# Patient Record
Sex: Female | Born: 1960 | Race: White | Hispanic: Yes | Smoking: Former smoker
Health system: Southern US, Community
[De-identification: ages and names within clinical notes are randomized; demographics above are authoritative.]

## PROBLEM LIST (undated history)

## (undated) DIAGNOSIS — K929 Disease of digestive system, unspecified: Secondary | ICD-10-CM

## (undated) DIAGNOSIS — K760 Fatty (change of) liver, not elsewhere classified: Secondary | ICD-10-CM

## (undated) DIAGNOSIS — R600 Localized edema: Secondary | ICD-10-CM

## (undated) DIAGNOSIS — F329 Major depressive disorder, single episode, unspecified: Secondary | ICD-10-CM

## (undated) DIAGNOSIS — E669 Obesity, unspecified: Secondary | ICD-10-CM

## (undated) DIAGNOSIS — K635 Polyp of colon: Secondary | ICD-10-CM

## (undated) DIAGNOSIS — E785 Hyperlipidemia, unspecified: Secondary | ICD-10-CM

## (undated) DIAGNOSIS — M549 Dorsalgia, unspecified: Secondary | ICD-10-CM

## (undated) DIAGNOSIS — F419 Anxiety disorder, unspecified: Secondary | ICD-10-CM

## (undated) DIAGNOSIS — R51 Headache: Secondary | ICD-10-CM

## (undated) DIAGNOSIS — F32A Depression, unspecified: Secondary | ICD-10-CM

## (undated) DIAGNOSIS — T7840XA Allergy, unspecified, initial encounter: Secondary | ICD-10-CM

## (undated) DIAGNOSIS — U099 Post covid-19 condition, unspecified: Secondary | ICD-10-CM

## (undated) DIAGNOSIS — K219 Gastro-esophageal reflux disease without esophagitis: Secondary | ICD-10-CM

## (undated) HISTORY — DX: Dorsalgia, unspecified: M54.9

## (undated) HISTORY — DX: Gastro-esophageal reflux disease without esophagitis: K21.9

## (undated) HISTORY — DX: Fatty (change of) liver, not elsewhere classified: K76.0

## (undated) HISTORY — DX: Hyperlipidemia, unspecified: E78.5

## (undated) HISTORY — DX: Disease of digestive system, unspecified: K92.9

## (undated) HISTORY — DX: Headache: R51

## (undated) HISTORY — DX: Obesity, unspecified: E66.9

## (undated) HISTORY — PX: OTHER SURGICAL HISTORY: SHX169

## (undated) HISTORY — DX: Polyp of colon: K63.5

## (undated) HISTORY — DX: Localized edema: R60.0

## (undated) HISTORY — DX: Post covid-19 condition, unspecified: U09.9

## (undated) HISTORY — DX: Anxiety disorder, unspecified: F41.9

## (undated) HISTORY — PX: LAPAROSCOPY: SHX197

## (undated) HISTORY — PX: TONSILLECTOMY AND ADENOIDECTOMY: SUR1326

## (undated) HISTORY — DX: Major depressive disorder, single episode, unspecified: F32.9

## (undated) HISTORY — DX: Depression, unspecified: F32.A

## (undated) HISTORY — DX: Allergy, unspecified, initial encounter: T78.40XA

---

## 1985-07-07 HISTORY — PX: BREAST BIOPSY: SHX20

## 2000-07-07 HISTORY — PX: UTERINE FIBROID SURGERY: SHX826

## 2001-06-20 ENCOUNTER — Encounter (INDEPENDENT_AMBULATORY_CARE_PROVIDER_SITE_OTHER): Payer: Self-pay

## 2001-06-21 ENCOUNTER — Encounter: Payer: Self-pay | Admitting: Emergency Medicine

## 2001-06-21 ENCOUNTER — Inpatient Hospital Stay (HOSPITAL_COMMUNITY): Admission: EM | Admit: 2001-06-21 | Discharge: 2001-06-23 | Payer: Self-pay | Admitting: Emergency Medicine

## 2002-05-09 ENCOUNTER — Emergency Department (HOSPITAL_COMMUNITY): Admission: EM | Admit: 2002-05-09 | Discharge: 2002-05-09 | Payer: Self-pay | Admitting: Emergency Medicine

## 2006-05-01 ENCOUNTER — Other Ambulatory Visit: Admission: RE | Admit: 2006-05-01 | Discharge: 2006-05-01 | Payer: Self-pay | Admitting: Obstetrics and Gynecology

## 2006-09-14 ENCOUNTER — Other Ambulatory Visit: Admission: RE | Admit: 2006-09-14 | Discharge: 2006-09-14 | Payer: Self-pay | Admitting: Obstetrics and Gynecology

## 2007-04-08 ENCOUNTER — Ambulatory Visit: Payer: Self-pay | Admitting: Internal Medicine

## 2007-04-08 ENCOUNTER — Encounter: Payer: Self-pay | Admitting: Internal Medicine

## 2007-04-08 DIAGNOSIS — R635 Abnormal weight gain: Secondary | ICD-10-CM | POA: Insufficient documentation

## 2007-04-08 DIAGNOSIS — R109 Unspecified abdominal pain: Secondary | ICD-10-CM | POA: Insufficient documentation

## 2007-04-08 LAB — CONVERTED CEMR LAB
ALT: 18 units/L (ref 0–35)
AST: 20 units/L (ref 0–37)
Albumin: 3.9 g/dL (ref 3.5–5.2)
Alkaline Phosphatase: 86 units/L (ref 39–117)
BUN: 11 mg/dL (ref 6–23)
Bacteria, UA: NEGATIVE
Basophils Absolute: 0 10*3/uL (ref 0.0–0.1)
Basophils Relative: 0.6 % (ref 0.0–1.0)
Bilirubin Urine: NEGATIVE
Bilirubin, Direct: 0.1 mg/dL (ref 0.0–0.3)
CO2: 30 meq/L (ref 19–32)
Calcium: 9.8 mg/dL (ref 8.4–10.5)
Chloride: 109 meq/L (ref 96–112)
Cholesterol: 222 mg/dL (ref 0–200)
Creatinine, Ser: 0.8 mg/dL (ref 0.4–1.2)
Crystals: NEGATIVE
Direct LDL: 139.6 mg/dL
Eosinophils Absolute: 0.2 10*3/uL (ref 0.0–0.6)
Eosinophils Relative: 2.6 % (ref 0.0–5.0)
GFR calc Af Amer: 100 mL/min
GFR calc non Af Amer: 82 mL/min
Glucose, Bld: 93 mg/dL (ref 70–99)
HCT: 39.3 % (ref 36.0–46.0)
HDL: 74.1 mg/dL (ref >39.0)
Hemoglobin: 13.1 g/dL (ref 12.0–15.0)
Hgb A1c MFr Bld: 5.5 % (ref 4.6–6.0)
Ketones, ur: NEGATIVE mg/dL
Leukocytes, UA: NEGATIVE
Lymphocytes Relative: 46.7 % — ABNORMAL HIGH (ref 12.0–46.0)
MCHC: 33.4 g/dL (ref 30.0–36.0)
MCV: 85.6 fL (ref 78.0–100.0)
Monocytes Absolute: 0.4 10*3/uL (ref 0.2–0.7)
Monocytes Relative: 6.4 % (ref 3.0–11.0)
Mucus, UA: NEGATIVE
Neutro Abs: 2.9 10*3/uL (ref 1.4–7.7)
Neutrophils Relative %: 43.7 % (ref 43.0–77.0)
Nitrite: NEGATIVE
Platelets: 197 10*3/uL (ref 150–400)
Potassium: 4.7 meq/L (ref 3.5–5.1)
RBC: 4.6 M/uL (ref 3.87–5.11)
RDW: 12.8 % (ref 11.5–14.6)
Sodium: 146 meq/L — ABNORMAL HIGH (ref 135–145)
Specific Gravity, Urine: 1.01 (ref 1.000–1.03)
TSH: 1.26 microintl units/mL (ref 0.35–5.50)
Total Bilirubin: 0.5 mg/dL (ref 0.3–1.2)
Total CHOL/HDL Ratio: 3
Total Protein, Urine: NEGATIVE mg/dL
Total Protein: 7.4 g/dL (ref 6.0–8.3)
Triglycerides: 45 mg/dL (ref 0–149)
Urine Glucose: NEGATIVE mg/dL
Urobilinogen, UA: 0.2 (ref 0.0–1.0)
VLDL: 9 mg/dL (ref 0–40)
WBC: 6.7 10*3/uL (ref 4.5–10.5)
pH: 6 (ref 5.0–8.0)

## 2007-04-09 DIAGNOSIS — R519 Headache, unspecified: Secondary | ICD-10-CM | POA: Insufficient documentation

## 2007-04-09 DIAGNOSIS — R51 Headache: Secondary | ICD-10-CM | POA: Insufficient documentation

## 2007-04-09 DIAGNOSIS — E785 Hyperlipidemia, unspecified: Secondary | ICD-10-CM | POA: Insufficient documentation

## 2007-04-30 ENCOUNTER — Ambulatory Visit: Payer: Self-pay | Admitting: Internal Medicine

## 2007-06-28 ENCOUNTER — Other Ambulatory Visit: Admission: RE | Admit: 2007-06-28 | Discharge: 2007-06-28 | Payer: Self-pay | Admitting: Obstetrics and Gynecology

## 2007-11-05 ENCOUNTER — Encounter
Admission: RE | Admit: 2007-11-05 | Discharge: 2007-11-05 | Payer: Self-pay | Admitting: Physical Medicine and Rehabilitation

## 2008-02-16 ENCOUNTER — Encounter
Admission: RE | Admit: 2008-02-16 | Discharge: 2008-02-16 | Payer: Self-pay | Admitting: Physical Medicine & Rehabilitation

## 2008-10-20 ENCOUNTER — Other Ambulatory Visit: Admission: RE | Admit: 2008-10-20 | Discharge: 2008-10-20 | Payer: Self-pay | Admitting: Obstetrics & Gynecology

## 2008-10-24 ENCOUNTER — Encounter: Admission: RE | Admit: 2008-10-24 | Discharge: 2008-10-24 | Payer: Self-pay | Admitting: Obstetrics and Gynecology

## 2010-11-22 NOTE — Discharge Summary (Signed)
[facility]  Patient:    Linda Mata, Linda Mata Visit Number: [id number] MRN: [id number]          Service Type: SUR Location: 3W 0357 01 Attending Physician:  Lendon Colonel Dictated by:   Kathie Rhodes. Kyra Manges, M.D. Admit Date:  06/20/2001 Discharge Date: 06/23/2001   CC:         Anselm Pancoast. Zachery Dakins, M.D.   Discharge Summary  ADMISSION DIAGNOSIS:  Acute abdomen.  DISCHARGE DIAGNOSIS:  Infarcted uterine leiomyoma.  OPERATIONS PERFORMED:  Diagnostic laparoscopy with exploratory laparotomy and removal of infarct myoma.  BRIEF HISTORY:  [name] was admitted from the ER with an acute abdomen, initially felt to be possible ruptured appendix.  LABORATORY DATA:  The admission hemoglobin was 13 and white count 14,000. Routine chemistries were within normal limits.  Culture of the peritoneal fluid revealed rare white cells.  No growth was noted.  The pathology report revealed an infarcted myoma.  HOSPITAL COURSE:  The patient was admitted to the hospital and underwent an uneventful laparoscopy and laparotomy with excision of infarcted myoma. Postoperatively she did quite well.  DISPOSITION:  The patient was discharged on June 21, 2001, to home and office care.  FOLLOW-UP:  She will be followed up by Trinda Pascal, N.P., can call for fever, bleeding, or any other difficulty that she may encounter.  CONDITION ON DISCHARGE:  Improved. Dictated by:   S. Kyra Manges, M.D. Attending Physician:  Lendon Colonel DD:  07/02/01 TD:  07/03/01 Job: [id number]

## 2010-11-22 NOTE — Op Note (Signed)
[facility]  Patient:    Linda Mata, CADMUS Visit Number: [id number] MRN: [id number]          Service Type: SUR Location: 3W 0357 01 Attending Physician:  Henrene Dodge Dictated by:   Kathie Rhodes. Kyra Manges, M.D. Proc. Date: 06/21/01 Admit Date:  06/20/2001                             Operative Report  PREOPERATIVE DIAGNOSIS:  Acute abdomen, possible ruptured appendix.  POSTOPERATIVE DIAGNOSIS:  Infarcted uterine fibroid.  OPERATION PERFORMED:  Diagnostic laparoscopy with exploratory laparotomy, removal of posterior myoma.  DESCRIPTION OF PROCEDURE:  The patient was placed in lithotomy position and prepped and draped in the usual fashion.  Foley catheter was inserted, and the patient was examined.  There was a large posterior mass that was quite firm and was not consistent with a pelvic abscess.  A transverse incision was made in the umbilicus.  The abdomen was distended with carbon dioxide using  a Veress needle.  Aspiration infusion technique was utilized.  About two liters of CO2 were infused.  Trocar was inserted into the abdomen and visualization of the pelvis accomplished.  There was a large posterior mass that had secondary inflammation around the sigmoid colon and the cecum.  The appendix was visualized and normal.  The right tube was edematous.  The right ovary was normal in appearance.  The left tube and ovary were normal.  The pelvic mass was felt to need more exploration, so a transverse incision was made in the abdomen, hemostasis with the Bovie.  The fascia was opened transversely and the peritoneum opened vertically.  The fibroid was the delivered into the operative wound, was excised.  The base of the fibroid was quite small, less than 0.5 cm, and this was closed with figure-of-eight 0 chromic sutures. There was a small fibroid anteriorly, and this was left alone.  The left ovary and tube were normal.  The right tube was edematous.   Copious amounts of saline were used to irrigate the pelvis.  The appendix was normal.  The parietal peritoneum was then closed with 2-0 PDS.  The fascia was closed with 2-0 PDS and 2-0 chromic figure-of-eight sutures.  The subcutaneum was closed with 2-0 Vicryl interrupted, and the skin was closed with a subcuticular suture of 4-0 PDS.  The umbilical incision was closed with one suture of 4-0 PDS.  The patient was then awakened and carried to recovery room in good condition. Dictated by:   S. Kyra Manges, M.D. Attending Physician:  Henrene Dodge DD:  06/21/01 TD:  06/22/01 Job: [id number]

## 2010-12-10 ENCOUNTER — Other Ambulatory Visit: Payer: Self-pay | Admitting: Internal Medicine

## 2010-12-13 ENCOUNTER — Ambulatory Visit
Admission: RE | Admit: 2010-12-13 | Discharge: 2010-12-13 | Disposition: A | Discharge status: Home / Self Care | Payer: BC Managed Care – PPO | Source: Ambulatory Visit | Attending: Internal Medicine | Admitting: Internal Medicine

## 2010-12-13 ENCOUNTER — Other Ambulatory Visit: Payer: Self-pay | Admitting: Internal Medicine

## 2010-12-13 DIAGNOSIS — R102 Pelvic and perineal pain: Secondary | ICD-10-CM

## 2010-12-18 ENCOUNTER — Other Ambulatory Visit: Payer: Self-pay | Admitting: Internal Medicine

## 2010-12-18 ENCOUNTER — Ambulatory Visit
Admission: RE | Admit: 2010-12-18 | Discharge: 2010-12-18 | Disposition: A | Discharge status: Home / Self Care | Payer: BC Managed Care – PPO | Source: Ambulatory Visit | Attending: Internal Medicine | Admitting: Internal Medicine

## 2010-12-18 DIAGNOSIS — Z1231 Encounter for screening mammogram for malignant neoplasm of breast: Secondary | ICD-10-CM

## 2010-12-18 DIAGNOSIS — R102 Pelvic and perineal pain: Secondary | ICD-10-CM

## 2011-09-17 ENCOUNTER — Emergency Department (HOSPITAL_BASED_OUTPATIENT_CLINIC_OR_DEPARTMENT_OTHER)
Admission: EM | Admit: 2011-09-17 | Discharge: 2011-09-17 | Disposition: A | Discharge status: Home / Self Care | Payer: BC Managed Care – PPO

## 2011-09-24 ENCOUNTER — Other Ambulatory Visit: Payer: Self-pay | Admitting: Internal Medicine

## 2011-09-24 ENCOUNTER — Telehealth: Payer: Self-pay | Admitting: Internal Medicine

## 2011-09-24 DIAGNOSIS — M79672 Pain in left foot: Secondary | ICD-10-CM

## 2011-09-24 NOTE — Telephone Encounter (Signed)
Yes that's fine 

## 2011-09-24 NOTE — Telephone Encounter (Signed)
Called patient made appt for Monday 3.25.13 @ 11:15

## 2011-09-24 NOTE — Telephone Encounter (Signed)
Patient called to set up a NPE with Paz. She states she is having some acute symptoms of edema in her joint as well as unexplained bruising. Can I put in on Paz schedule for the acute visit only, so he can see her for this before the NPE appointment?

## 2011-09-27 ENCOUNTER — Other Ambulatory Visit: Payer: BC Managed Care – PPO

## 2011-09-29 ENCOUNTER — Telehealth: Payer: Self-pay | Admitting: Internal Medicine

## 2011-09-29 ENCOUNTER — Encounter: Payer: Self-pay | Admitting: *Deleted

## 2011-09-29 ENCOUNTER — Ambulatory Visit (INDEPENDENT_AMBULATORY_CARE_PROVIDER_SITE_OTHER)
Admission: RE | Admit: 2011-09-29 | Discharge: 2011-09-29 | Disposition: A | Discharge status: Home / Self Care | Payer: BC Managed Care – PPO | Source: Ambulatory Visit | Attending: Internal Medicine | Admitting: Internal Medicine

## 2011-09-29 ENCOUNTER — Ambulatory Visit (INDEPENDENT_AMBULATORY_CARE_PROVIDER_SITE_OTHER): Payer: BC Managed Care – PPO | Admitting: Internal Medicine

## 2011-09-29 ENCOUNTER — Encounter: Payer: Self-pay | Admitting: Internal Medicine

## 2011-09-29 VITALS — BP 112/78 | HR 78 | Temp 97.9°F | Wt 196.0 lb

## 2011-09-29 DIAGNOSIS — M79609 Pain in unspecified limb: Secondary | ICD-10-CM

## 2011-09-29 DIAGNOSIS — M79673 Pain in unspecified foot: Secondary | ICD-10-CM

## 2011-09-29 DIAGNOSIS — F329 Major depressive disorder, single episode, unspecified: Secondary | ICD-10-CM

## 2011-09-29 DIAGNOSIS — F32A Depression, unspecified: Secondary | ICD-10-CM | POA: Insufficient documentation

## 2011-09-29 DIAGNOSIS — R635 Abnormal weight gain: Secondary | ICD-10-CM

## 2011-09-29 NOTE — Assessment & Plan Note (Signed)
States several things are going  On on  her life including a recent divorce and loss of her father. Overall she reports is coping well. Denies the need of  medication  At this point

## 2011-09-29 NOTE — Assessment & Plan Note (Signed)
Left foot pain started earlier this month. Stress fracture?Marland Kitchen X-rays If pain continue will refer to orthopedic surgery

## 2011-09-29 NOTE — Patient Instructions (Signed)
Please get your x-ray at the other [facility]  office located at: [address], across from [facility].  Please go to the basement, this is a walk-in facility, they are open from 8:30 to 5:30 PM. Phone number [phone].  
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
## 2011-09-29 NOTE — Telephone Encounter (Signed)
Patient was just seen this morning and forgot to get a work note. She states that she will pick up note later this afternoon. Thanks!

## 2011-09-29 NOTE — Telephone Encounter (Signed)
Pt aware note ready for pick up  °

## 2011-10-15 ENCOUNTER — Encounter: Payer: Self-pay | Admitting: Internal Medicine

## 2011-10-15 ENCOUNTER — Ambulatory Visit (INDEPENDENT_AMBULATORY_CARE_PROVIDER_SITE_OTHER): Payer: BC Managed Care – PPO | Admitting: Internal Medicine

## 2011-10-15 VITALS — BP 114/70 | HR 72 | Temp 98.0°F | Wt 202.6 lb

## 2011-10-15 DIAGNOSIS — R609 Edema, unspecified: Secondary | ICD-10-CM

## 2011-10-15 MED ORDER — FUROSEMIDE 40 MG PO TABS: 40.0000 mg | ORAL_TABLET | Freq: Every day | ORAL | Status: DC

## 2011-10-15 NOTE — Patient Instructions (Addendum)
Avoid ingestion of  excess salt/sodium.Cook with pepper & other spices . Use the salt substitute "No Salt"(unless your potassium has been elevated) OR the Mrs Sharilyn Sites products to season food @ the table. Avoid foods which taste salty or "vinegary" as their sodium contentet will be high.  Eat a low-fat diet with lots of fruits and vegetables, up to 7-9 servings per day. Consume less than 30 (preferably ZERO) grams of sugar per day from foods & drinks with High Fructose Corn Syrup (HFCS) sugar as #1,2,3 or # 4 on label.Whole Foods, [employer] Jo[employer] & [employer] do not carry products with HFCS. Follow a  low carb nutrition program such as [place] or The New Sugar Busters  to prevent Diabetes progression . White carbohydrates (potatoes, rice, bread, and pasta) have a high spike of sugar and a high load of sugar. For example a  baked potato has a cup of sugar and a  french fry  2 teaspoons of sugar. Yams, wild  rice, whole grained bread &  wheat pasta have been much lower spike and load of  sugar. Portions should be the size of a deck of cards or your palm.   Please stop the supplements for the time being as these do have medicinal effects and can interact causing adverse reactions.

## 2011-10-15 NOTE — Progress Notes (Signed)
  Subjective:    Patient ID: Linda Mata, female    DOB: [birth year], 51 y.o.   MRN: [id number]  HPI She describes swelling chiefly in her feet but also to some extent in the  hands in the past month. She denies any increased increase in salt. She has some orthopnea which she relates to being in environments with poor ventilation. She is able to participate in Zumba &  on the treadmill without exertional dyspnea.  She denies any paroxysmal nocturnal dyspnea  Her labs from earlier this year from another office were reviewed. Her BUN was 32 but creatinine and TSH were normal.    Review of Systems She was concerned about being diagnosed with depression. The depression was in the context of having lost her father and going through a divorce. The depression is resolving and was in normal response to the situation.  She is taking multiple supplements including beta carotene 25,000 units. She has been having 1-2 protein shakes daily  She is not on amlodipine     Objective:   Physical Exam She appears healthy and well-nourished and in no distress  Thyroid is normal to palpation without enlargement or nodules  Chest is clear with no rales or increased work of breathing  She has an S4 with a regular rhythm. There is a grade 1/2 systolic murmur which is insignificant  She has no neck vein distention at 15; there is no hepatojugular reflux  She has no organomegaly or masses  DTRs are equal and normal  I cannot appreciate edema of the hands but she is striking edema of the feet 1-1.5+ pitting edema.  She has no scleral icterus or jaundice       Assessment & Plan:  #1 significant pedal edema with recent normal renal function and thyroid function  Plan: She should continue salt restriction as she is presently doing. I would recommend stopping the supplements to assess response of edema. Furosemide will be initiated.

## 2011-10-15 NOTE — Assessment & Plan Note (Signed)
Her depression has resolved. 

## 2011-10-21 ENCOUNTER — Ambulatory Visit: Payer: BC Managed Care – PPO | Admitting: Internal Medicine

## 2011-10-22 ENCOUNTER — Ambulatory Visit: Payer: BC Managed Care – PPO | Admitting: Internal Medicine

## 2012-01-06 ENCOUNTER — Ambulatory Visit (INDEPENDENT_AMBULATORY_CARE_PROVIDER_SITE_OTHER): Payer: BC Managed Care – PPO | Admitting: Internal Medicine

## 2012-01-06 ENCOUNTER — Encounter: Payer: Self-pay | Admitting: Internal Medicine

## 2012-01-06 VITALS — BP 110/68 | HR 74 | Temp 98.2°F | Wt 200.0 lb

## 2012-01-06 DIAGNOSIS — Z833 Family history of diabetes mellitus: Secondary | ICD-10-CM

## 2012-01-06 DIAGNOSIS — G8929 Other chronic pain: Secondary | ICD-10-CM

## 2012-01-06 DIAGNOSIS — K219 Gastro-esophageal reflux disease without esophagitis: Secondary | ICD-10-CM

## 2012-01-06 DIAGNOSIS — R1310 Dysphagia, unspecified: Secondary | ICD-10-CM

## 2012-01-06 DIAGNOSIS — R350 Frequency of micturition: Secondary | ICD-10-CM

## 2012-01-06 DIAGNOSIS — M549 Dorsalgia, unspecified: Secondary | ICD-10-CM

## 2012-01-06 MED ORDER — RANITIDINE HCL 150 MG PO TABS: 150.0000 mg | ORAL_TABLET | Freq: Two times a day (BID) | ORAL | Status: DC

## 2012-01-06 NOTE — Patient Instructions (Addendum)
The triggers for reflux  include stress; the "aspirin family" ; alcohol; peppermint; and caffeine (coffee, tea, cola, and chocolate). The aspirin family would include aspirin and the nonsteroidal agents such as ibuprofen , Naproxen & Meloxicam. Tylenol would not cause reflux. If having symptoms ; food & drink should be avoided for @ least 2 hours before going to bed.  Please complete stool cards  Please try to go on My Chart within the next 24 hours to allow me to release the results directly to you.
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
## 2012-01-07 ENCOUNTER — Encounter: Payer: Self-pay | Admitting: Internal Medicine

## 2012-01-07 LAB — POCT URINALYSIS DIPSTICK
Glucose, UA: NEGATIVE
Ketones, UA: NEGATIVE
Leukocytes, UA: NEGATIVE
Spec Grav, UA: 1.02
Urobilinogen, UA: 0.2

## 2012-01-07 LAB — CBC WITH DIFFERENTIAL/PLATELET
Basophils Absolute: 0.1 10*3/uL (ref 0.0–0.1)
Basophils Relative: 0.8 % (ref 0.0–3.0)
Hemoglobin: 13.7 g/dL (ref 12.0–15.0)
Lymphocytes Relative: 35.6 % (ref 12.0–46.0)
Monocytes Relative: 7.7 % (ref 3.0–12.0)
Neutro Abs: 3.7 10*3/uL (ref 1.4–7.7)
RBC: 4.86 Mil/uL (ref 3.87–5.11)

## 2012-01-09 ENCOUNTER — Other Ambulatory Visit (INDEPENDENT_AMBULATORY_CARE_PROVIDER_SITE_OTHER): Payer: BC Managed Care – PPO

## 2012-01-09 DIAGNOSIS — Z1289 Encounter for screening for malignant neoplasm of other sites: Secondary | ICD-10-CM

## 2012-01-09 LAB — POC HEMOCCULT BLD/STL (OFFICE/1-CARD/DIAGNOSTIC): Fecal Occult Blood, POC: NEGATIVE

## 2012-02-05 ENCOUNTER — Ambulatory Visit (INDEPENDENT_AMBULATORY_CARE_PROVIDER_SITE_OTHER): Payer: BC Managed Care – PPO | Admitting: Internal Medicine

## 2012-02-05 ENCOUNTER — Encounter: Payer: Self-pay | Admitting: Internal Medicine

## 2012-02-05 VITALS — BP 100/64 | HR 72 | Ht 66.0 in | Wt 200.4 lb

## 2012-02-05 DIAGNOSIS — R0789 Other chest pain: Secondary | ICD-10-CM

## 2012-02-05 DIAGNOSIS — K219 Gastro-esophageal reflux disease without esophagitis: Secondary | ICD-10-CM

## 2012-02-05 DIAGNOSIS — Z1211 Encounter for screening for malignant neoplasm of colon: Secondary | ICD-10-CM

## 2012-02-05 DIAGNOSIS — R1310 Dysphagia, unspecified: Secondary | ICD-10-CM

## 2012-02-05 MED ORDER — DEXLANSOPRAZOLE 60 MG PO CPDR: 60.0000 mg | DELAYED_RELEASE_CAPSULE | Freq: Every day | ORAL | Status: DC

## 2012-02-05 NOTE — Patient Instructions (Signed)
We have given you some samples of Dexilant  We will call you in September to schedule the colonoscopy per your request

## 2012-02-06 ENCOUNTER — Encounter: Payer: Self-pay | Admitting: Internal Medicine
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
## 2012-04-01 ENCOUNTER — Telehealth: Payer: Self-pay | Admitting: Internal Medicine

## 2012-04-02 ENCOUNTER — Encounter: Payer: Self-pay | Admitting: Internal Medicine

## 2012-04-02 MED ORDER — DEXLANSOPRAZOLE 60 MG PO CPDR: 60.0000 mg | DELAYED_RELEASE_CAPSULE | Freq: Every day | ORAL | Status: DC

## 2012-04-02 NOTE — Telephone Encounter (Signed)
Pt called back to scheduled her ECL. Pt had one date to look at but Dr. Marina Goodell is in the office that day. Went through the schedule and gave the pt several days to look at to see if they will work for her. Pt also asked for Dexilant samples. Pt is to call back if any of these dates work. Dexilant samples left up front for pick up. Pt aware.

## 2012-04-07 ENCOUNTER — Encounter: Payer: Self-pay | Admitting: Internal Medicine

## 2012-04-13 ENCOUNTER — Encounter: Payer: BC Managed Care – PPO | Admitting: Internal Medicine

## 2012-04-20 ENCOUNTER — Ambulatory Visit (AMBULATORY_SURGERY_CENTER): Payer: BC Managed Care – PPO | Admitting: *Deleted

## 2012-04-20 VITALS — Ht 66.0 in | Wt 206.0 lb

## 2012-04-20 DIAGNOSIS — Z1211 Encounter for screening for malignant neoplasm of colon: Secondary | ICD-10-CM

## 2012-04-20 DIAGNOSIS — IMO0001 Reserved for inherently not codable concepts without codable children: Secondary | ICD-10-CM

## 2012-04-20 DIAGNOSIS — K219 Gastro-esophageal reflux disease without esophagitis: Secondary | ICD-10-CM

## 2012-04-20 MED ORDER — MOVIPREP 100 G PO SOLR: ORAL | Status: DC

## 2012-04-21 ENCOUNTER — Encounter: Payer: Self-pay | Admitting: Internal Medicine

## 2012-04-30 ENCOUNTER — Encounter: Payer: Self-pay | Admitting: Internal Medicine

## 2012-04-30 ENCOUNTER — Ambulatory Visit (AMBULATORY_SURGERY_CENTER): Payer: BC Managed Care – PPO | Admitting: Internal Medicine

## 2012-04-30 VITALS — BP 110/71 | HR 69 | Temp 97.8°F | Resp 13 | Ht 66.0 in | Wt 206.0 lb

## 2012-04-30 DIAGNOSIS — D126 Benign neoplasm of colon, unspecified: Secondary | ICD-10-CM

## 2012-04-30 DIAGNOSIS — Z1211 Encounter for screening for malignant neoplasm of colon: Secondary | ICD-10-CM

## 2012-04-30 DIAGNOSIS — K219 Gastro-esophageal reflux disease without esophagitis: Secondary | ICD-10-CM

## 2012-04-30 MED ORDER — SODIUM CHLORIDE 0.9 % IV SOLN: 500.0000 mL | INTRAVENOUS | Status: DC

## 2012-04-30 NOTE — Op Note (Signed)
[facility] [address] [place], [address]   COLONOSCOPY PROCEDURE REPORT  PATIENT: [name], [name]  MR#: [id number] BIRTHDATE: 06-15-1961 , 50  yrs. old GENDER: Female ENDOSCOPIST: Roxy Cedar, MD REFERRED MV:HQIONGE Alwyn Ren, M.D. PROCEDURE DATE:  04/30/2012 PROCEDURE:   Colonoscopy with snare polypectomy    x 2 ASA CLASS:   Class II INDICATIONS:average risk screening. MEDICATIONS: MAC sedation, administered by CRNA and propofol (Diprivan) 340mg  IV  DESCRIPTION OF PROCEDURE:   After the risks benefits and alternatives of the procedure were thoroughly explained, informed consent was obtained.  A digital rectal exam revealed no abnormalities of the rectum.   The LB [id number]-[id number]  endoscope was introduced through the anus and advanced to the cecum, which was identified by both the appendix and ileocecal valve. No adverse events experienced.   The quality of the prep was adequate, using MoviPrep  The instrument was then slowly withdrawn as the colon was fully examined.      COLON FINDINGS: Two pedunculated polyps measuring 8 and 10 mm in size were found in the sigmoid colon.  A polypectomy was performed using snare cautery.  The resection was complete and the polyp tissue was completely retrieved.   The colon was otherwise normal. There was no diverticulosis, inflammation, other polyps or cancers .  Retroflexed views revealed no abnormalities. The time to cecum=6 minutes 51 seconds.  Withdrawal time=18 minutes 02 seconds.  The scope was withdrawn and the procedure completed. COMPLICATIONS: There were no complications.  ENDOSCOPIC IMPRESSION: 1.   Two pedunculated polyps measuring 8 and 10 mm in size were found in the sigmoid colon; polypectomy was performed using snare cautery 2.   The colon was otherwise normal  RECOMMENDATIONS: 1. Repeat Colonoscopy in 3 years.   eSigned:  Roxy Cedar, MD 04/30/2012 3:05 PM   cc: Pecola Lawless, MD and The Patient   PATIENT NAME:  Linda Mata, Linda Mata MR#: [id number]
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
## 2012-04-30 NOTE — Op Note (Signed)
[facility] [address] [place], [address]   ENDOSCOPY PROCEDURE REPORT  PATIENT: Linda Mata, Linda Mata  MR#: [id number] BIRTHDATE: 19-Mar-1961 , 50  yrs. old GENDER: Female ENDOSCOPIST: Roxy Cedar, MD REFERRED BY:  Marga Melnick, M.D. PROCEDURE DATE:  04/30/2012 PROCEDURE:  EGD, diagnostic ASA CLASS:     Class II INDICATIONS:  history of esophageal reflux.   chest pain.  DEXILANT SAMPLES HELPED SYMPTOMS MEDICATIONS: MAC sedation, administered by CRNA and propofol (Diprivan) 140mg  IV TOPICAL ANESTHETIC: Cetacaine Spray  DESCRIPTION OF PROCEDURE: After the risks benefits and alternatives of the procedure were thoroughly explained, informed consent was obtained.  The LB GIF-H180 [id number] endoscope was introduced through the mouth and advanced to the second portion of the duodenum. Without limitations.  The instrument was slowly withdrawn as the mucosa was fully examined.    The proximal and midesophagus were normal.  the distal esophagus revealed mild esophagitis as manifested by friability of the mucosal Z.  line.  No Barrett's esophagus or stricture.  The gastric mucosa was entirely normal.  A retroflexed view of the stomach revealed a small sliding hiatal hernia.  The duodenal bulb and post bulbar duodenum were normal.  Retroflexed views revealed a hiatal hernia.     The scope was then withdrawn from the patient and the procedure completed.  COMPLICATIONS: There were no complications. ENDOSCOPIC IMPRESSION: The proximal and midesophagus were normal.  the distal esophagus revealed mild esophagitis as manifested by friability of the mucosal Z.  line.  No Barrett's esophagus or stricture.  The gastric mucosa was entirely normal.  A retroflexed view of the stomach revealed a small sliding hiatal hernia.  The duodenal bulb and post bulbar duodenum were normal  RECOMMENDATIONS: 1.  Anti-reflux regimen to be followed 2.  Prilosec OTC 20 mg daily  (PATIENT DOES NOT WANT A PRESCRIPTION FOR DEXILANT OR OTHER PPI) 3. RETURN TO THE CARE OF DR HOPPER  REPEAT EXAM:  eSigned:  Roxy Cedar, MD 04/30/2012 3:16 PM   OZ:[id number] Minerva Ends, MD and The Patient

## 2012-04-30 NOTE — Progress Notes (Addendum)
Patient stated that she did not start her second prep until later on this morning.  States that she was "late."  Poor prep noted.

## 2012-04-30 NOTE — Patient Instructions (Addendum)
Discharge instructions given with verbal understanding. Handouts on polyps, esophagitis and a hiatal hernia given. Resume previous medications. YOU HAD AN ENDOSCOPIC PROCEDURE TODAY AT THE [facility]: Refer to the procedure report that was given to you for any specific questions about what was found during the examination.  If the procedure report does not answer your questions, please call your gastroenterologist to clarify.  If you requested that your care partner not be given the details of your procedure findings, then the procedure report has been included in a sealed envelope for you to review at your convenience later.  YOU SHOULD EXPECT: Some feelings of bloating in the abdomen. Passage of more gas than usual.  Walking can help get rid of the air that was put into your GI tract during the procedure and reduce the bloating. If you had a lower endoscopy (such as a colonoscopy or flexible sigmoidoscopy) you may notice spotting of blood in your stool or on the toilet paper. If you underwent a bowel prep for your procedure, then you may not have a normal bowel movement for a few days.  DIET: Your first meal following the procedure should be a light meal and then it is ok to progress to your normal diet.  A half-sandwich or bowl of soup is an example of a good first meal.  Heavy or fried foods are harder to digest and may make you feel nauseous or bloated.  Likewise meals heavy in dairy and vegetables can cause extra gas to form and this can also increase the bloating.  Drink plenty of fluids but you should avoid alcoholic beverages for 24 hours.  ACTIVITY: Your care partner should take you home directly after the procedure.  You should plan to take it easy, moving slowly for the rest of the day.  You can resume normal activity the day after the procedure however you should NOT DRIVE or use heavy machinery for 24 hours (because of the sedation medicines used during the test).    SYMPTOMS  TO REPORT IMMEDIATELY: A gastroenterologist can be reached at any hour.  During normal business hours, 8:30 AM to 5:00 PM Monday through Friday, call [phone].  After hours and on weekends, please call the GI answering service at [phone] who will take a message and have the physician on call contact you.   Following lower endoscopy (colonoscopy or flexible sigmoidoscopy):  Excessive amounts of blood in the stool  Significant tenderness or worsening of abdominal pains  Swelling of the abdomen that is new, acute  Fever of 100F or higher  Following upper endoscopy (EGD)  Vomiting of blood or coffee ground material  New chest pain or pain under the shoulder blades  Painful or persistently difficult swallowing  New shortness of breath  Fever of 100F or higher  Black, tarry-looking stools  FOLLOW UP: If any biopsies were taken you will be contacted by phone or by letter within the next 1-3 weeks.  Call your gastroenterologist if you have not heard about the biopsies in 3 weeks.  Our staff will call the home number listed on your records the next business day following your procedure to check on you and address any questions or concerns that you may have at that time regarding the information given to you following your procedure. This is a courtesy call and so if there is no answer at the home number and we have not heard from you through the emergency physician on call, we will  assume that you have returned to your regular daily activities without incident.  SIGNATURES/CONFIDENTIALITY: You and/or your care partner have signed paperwork which will be entered into your electronic medical record.  These signatures attest to the fact that that the information above on your After Visit Summary has been reviewed and is understood.  Full responsibility of the confidentiality of this discharge information lies with you and/or your care-partner. 

## 2012-05-03 ENCOUNTER — Telehealth: Payer: Self-pay | Admitting: *Deleted

## 2012-05-03 NOTE — Telephone Encounter (Signed)
Left message on number given in admitting fri as directed per pt/ ewm

## 2012-05-06 ENCOUNTER — Encounter: Payer: Self-pay | Admitting: Internal Medicine

## 2012-05-31 ENCOUNTER — Other Ambulatory Visit: Payer: Self-pay | Admitting: Internal Medicine

## 2012-06-01 ENCOUNTER — Telehealth: Payer: Self-pay | Admitting: *Deleted

## 2012-06-01 NOTE — Telephone Encounter (Signed)
Pt had EGD with Dr. Marina Goodell, we had given her a few samples.  Pt asking for more samples.  I advised we can give her some today, will put at the front desk.  When she calls again we will have to do a perscription next time.  We only have so many samples to share.

## 2012-06-01 NOTE — Telephone Encounter (Signed)
Patient just had EGD with Dr. Marina Goodell recently.

## 2012-06-02 ENCOUNTER — Telehealth: Payer: Self-pay | Admitting: Internal Medicine

## 2012-06-02 NOTE — Telephone Encounter (Signed)
Pt has questions about meds. She is wandering why it hurts so much when she does NOT take the meds. I tried to help her understand that is the reason why the doctor wants her to take the meds but she just doesn't understand. pls call her

## 2012-06-02 NOTE — Telephone Encounter (Signed)
Spoke with patient, patient had a endoscopy/colonoscopy on 04/30/2012 and was informed that she has polyps, patient told she has a hernia and esophageal problems. Patient states if she does not take Dexilant she is in a lot of pain. Patient questions if she will be on Dexilant the rest of her life.  I informed patient she needs to continue to take Dexilant daily as directed by Dr.Perry. Patient to seek medical attention at Urgent Care or the emergency room if pain unbearable, otherwise patient instructed to follow-up with prescribing MD (Dr.Perry), patient verbalized understanding of instruction and indicated she had a contact number for Dr.Perry's office.

## 2012-06-11 ENCOUNTER — Telehealth: Payer: Self-pay | Admitting: Internal Medicine

## 2012-06-11 NOTE — Telephone Encounter (Signed)
Pt requests samples of dexilant. Call when ready for pick up.

## 2012-06-11 NOTE — Telephone Encounter (Signed)
2 weeks worth of samples placed at the front for pick-up. Left message on voicemail informing patient.

## 2012-07-09 ENCOUNTER — Telehealth: Payer: Self-pay | Admitting: Internal Medicine

## 2012-07-09 NOTE — Telephone Encounter (Signed)
See previous message

## 2012-07-09 NOTE — Telephone Encounter (Signed)
Refilled med

## 2012-09-07 ENCOUNTER — Telehealth: Payer: Self-pay | Admitting: Internal Medicine

## 2012-09-07 MED ORDER — DEXLANSOPRAZOLE 60 MG PO CPDR: 60.0000 mg | DELAYED_RELEASE_CAPSULE | Freq: Every day | ORAL | Status: DC

## 2012-09-07 NOTE — Telephone Encounter (Signed)
Pt states that when she does not take the dexilant she has a lot of stomach pain. Pt requests dexilant samples. Samples left up front for pt.

## 2012-09-07 NOTE — Telephone Encounter (Signed)
Patient called requesting samples of dexilant. CB# [phone]

## 2012-09-08 NOTE — Telephone Encounter (Signed)
Up to 1 month OK  if available

## 2012-09-08 NOTE — Telephone Encounter (Signed)
Left Pt message samples placed up front.

## 2012-11-15 ENCOUNTER — Encounter: Payer: Self-pay | Admitting: Internal Medicine

## 2012-11-15 ENCOUNTER — Ambulatory Visit (INDEPENDENT_AMBULATORY_CARE_PROVIDER_SITE_OTHER): Payer: BC Managed Care – PPO | Admitting: Internal Medicine

## 2012-11-15 VITALS — BP 126/80 | HR 86

## 2012-11-15 DIAGNOSIS — K219 Gastro-esophageal reflux disease without esophagitis: Secondary | ICD-10-CM

## 2012-11-15 DIAGNOSIS — H15009 Unspecified scleritis, unspecified eye: Secondary | ICD-10-CM

## 2012-11-15 DIAGNOSIS — R609 Edema, unspecified: Secondary | ICD-10-CM

## 2012-11-15 DIAGNOSIS — M79609 Pain in unspecified limb: Secondary | ICD-10-CM

## 2012-11-15 DIAGNOSIS — H15002 Unspecified scleritis, left eye: Secondary | ICD-10-CM

## 2012-11-15 DIAGNOSIS — M79672 Pain in left foot: Secondary | ICD-10-CM

## 2012-11-15 MED ORDER — FUROSEMIDE 40 MG PO TABS: 40.0000 mg | ORAL_TABLET | ORAL | Status: DC | PRN

## 2012-11-15 MED ORDER — OMEPRAZOLE 20 MG PO CPDR: 20.0000 mg | DELAYED_RELEASE_CAPSULE | Freq: Every day | ORAL | Status: DC

## 2012-11-15 NOTE — Patient Instructions (Addendum)
Reflux of gastric acid may be asymptomatic as this may occur mainly during sleep.The triggers for reflux  include stress; the "aspirin family" ; alcohol; peppermint; and caffeine (coffee, tea, cola, and chocolate). The aspirin family would include aspirin and the nonsteroidal agents such as ibuprofen &  Naproxen. Tylenol would not cause reflux. If having symptoms ; food & drink should be avoided for @ least 2 hours before going to bed.   Order for x-rays entered into  the computer; these will be performed at [facility]. No appointment is necessary.

## 2012-11-15 NOTE — Progress Notes (Signed)
  Subjective:    Patient ID: Linda Mata, female    DOB: 04/19/1961, 52 y.o.   MRN: [id number]  HPI  She has had intermittent swelling of the left ankle over the last 2 years. There was no trigger or precipitating event for this.  Slightly over 2 weeks ago she experienced very sharp pain in the left foot while sitting at a desk which resolved over several hours without treatment.  She also had redness in the left eye which was associated with some blurring of vision and photosensitivity to light. This resolved with rest.  She is under a great deal of stress going to school online; there are discipline administrative issues at school.    Review of Systems  She denies associated fever, chills, or sweats.  She's had no loss of vision or double vision. There's been no purulent discharge from the eye.   She believes that Dexilant causes mental status changes but it has helped her reflux related pain symptoms.     Objective:   Physical Exam  She appears healthy and well-nourished in no distress  Extraocular motion is intact. Pupils equal round reactive to light. There is prominence of the scleral vessels but no conjunctivitis. Vision is normal to confrontation as is field of vision.  Chest is clear with no increased work of breathing. No rales are present at the bases  She has a slow S4 with no murmur or gallop  Strength, tone, deep tendon reflexes are normal the lower extremities  Pedal pulses are intact.  Nail health is good. She has no ischemic skin changes.  She does have trace edema at the left ankle. There is tenderness to compression and palpation of the dorsum of the left foot.       Assessment & Plan:  #1 intermittent edema left ankle; the unilateral quality suggests venous incompetence on that side. Clinically there is no sign of cardiac issue which was of concern  #2 foot pain; rule out fracture of the small bones.  #3 scleritis, resolved. There is no  evidence of conjunctivitis; no visual deficit present

## 2012-11-26 ENCOUNTER — Ambulatory Visit (HOSPITAL_BASED_OUTPATIENT_CLINIC_OR_DEPARTMENT_OTHER)
Admission: RE | Admit: 2012-11-26 | Discharge: 2012-11-26 | Disposition: A | Discharge status: Home / Self Care | Payer: BC Managed Care – PPO | Source: Ambulatory Visit | Attending: Internal Medicine | Admitting: Internal Medicine

## 2012-11-26 DIAGNOSIS — M79672 Pain in left foot: Secondary | ICD-10-CM

## 2012-11-30 ENCOUNTER — Telehealth: Payer: Self-pay | Admitting: Internal Medicine

## 2012-11-30 DIAGNOSIS — R600 Localized edema: Secondary | ICD-10-CM

## 2012-11-30 NOTE — Telephone Encounter (Signed)
[place] LLE ; localized edema ; R/O DVT

## 2012-11-30 NOTE — Telephone Encounter (Signed)
Order Placed Pt aware. per Dr hopper this is not a stat order

## 2012-11-30 NOTE — Telephone Encounter (Signed)
Patient is calling and states that she went to radiology to have the x-ray that Dr. Alwyn Ren ordered and was told by the tech there that her foot was swollen and the x-ray would only cover her foot and not her ankle. She says that the tech advised that she would benefit more from an ultrasound. Patient says that she did not have the x-ray and wants to know if Dr. Alwyn Ren will put in orders for an ultrasound instead.

## 2012-11-30 NOTE — Telephone Encounter (Signed)
Hopp please advise  

## 2012-12-10 ENCOUNTER — Encounter (INDEPENDENT_AMBULATORY_CARE_PROVIDER_SITE_OTHER): Payer: BC Managed Care – PPO

## 2012-12-10 DIAGNOSIS — I70219 Atherosclerosis of native arteries of extremities with intermittent claudication, unspecified extremity: Secondary | ICD-10-CM

## 2012-12-10 DIAGNOSIS — R600 Localized edema: Secondary | ICD-10-CM

## 2012-12-14 ENCOUNTER — Encounter: Payer: Self-pay | Admitting: Cardiovascular Disease

## 2012-12-16 ENCOUNTER — Telehealth: Payer: Self-pay | Admitting: *Deleted

## 2012-12-16 NOTE — Telephone Encounter (Signed)
Spoke with pt who had multiple complaints about the phone system at [facility]. Pt stated she has no problem getting a "live person" at other [facility] sites and is really getting frustrated with her calls being constantly transferred when she follows the prompts and presses buttons on the phone. Pt went on to also complain about her wait time at [facility] while she was there for an x-ray. Patient stated she waited over 1.5 hours and that the tech said "they forgot she was there". I apologized and advised we were having the phone system problems looked into, pt replied "you better do something because people are not going to put up with that like I did."

## 2013-03-19 IMAGING — CR DG FOOT COMPLETE 3+V*L*
3 series · 3 of 3 positions shown · non-contrast
Comparison: None.

CLINICAL DATA: Chronic left foot pain.

LEFT FOOT - COMPLETE 3+ VIEW

[view not recorded (1 of 3)]
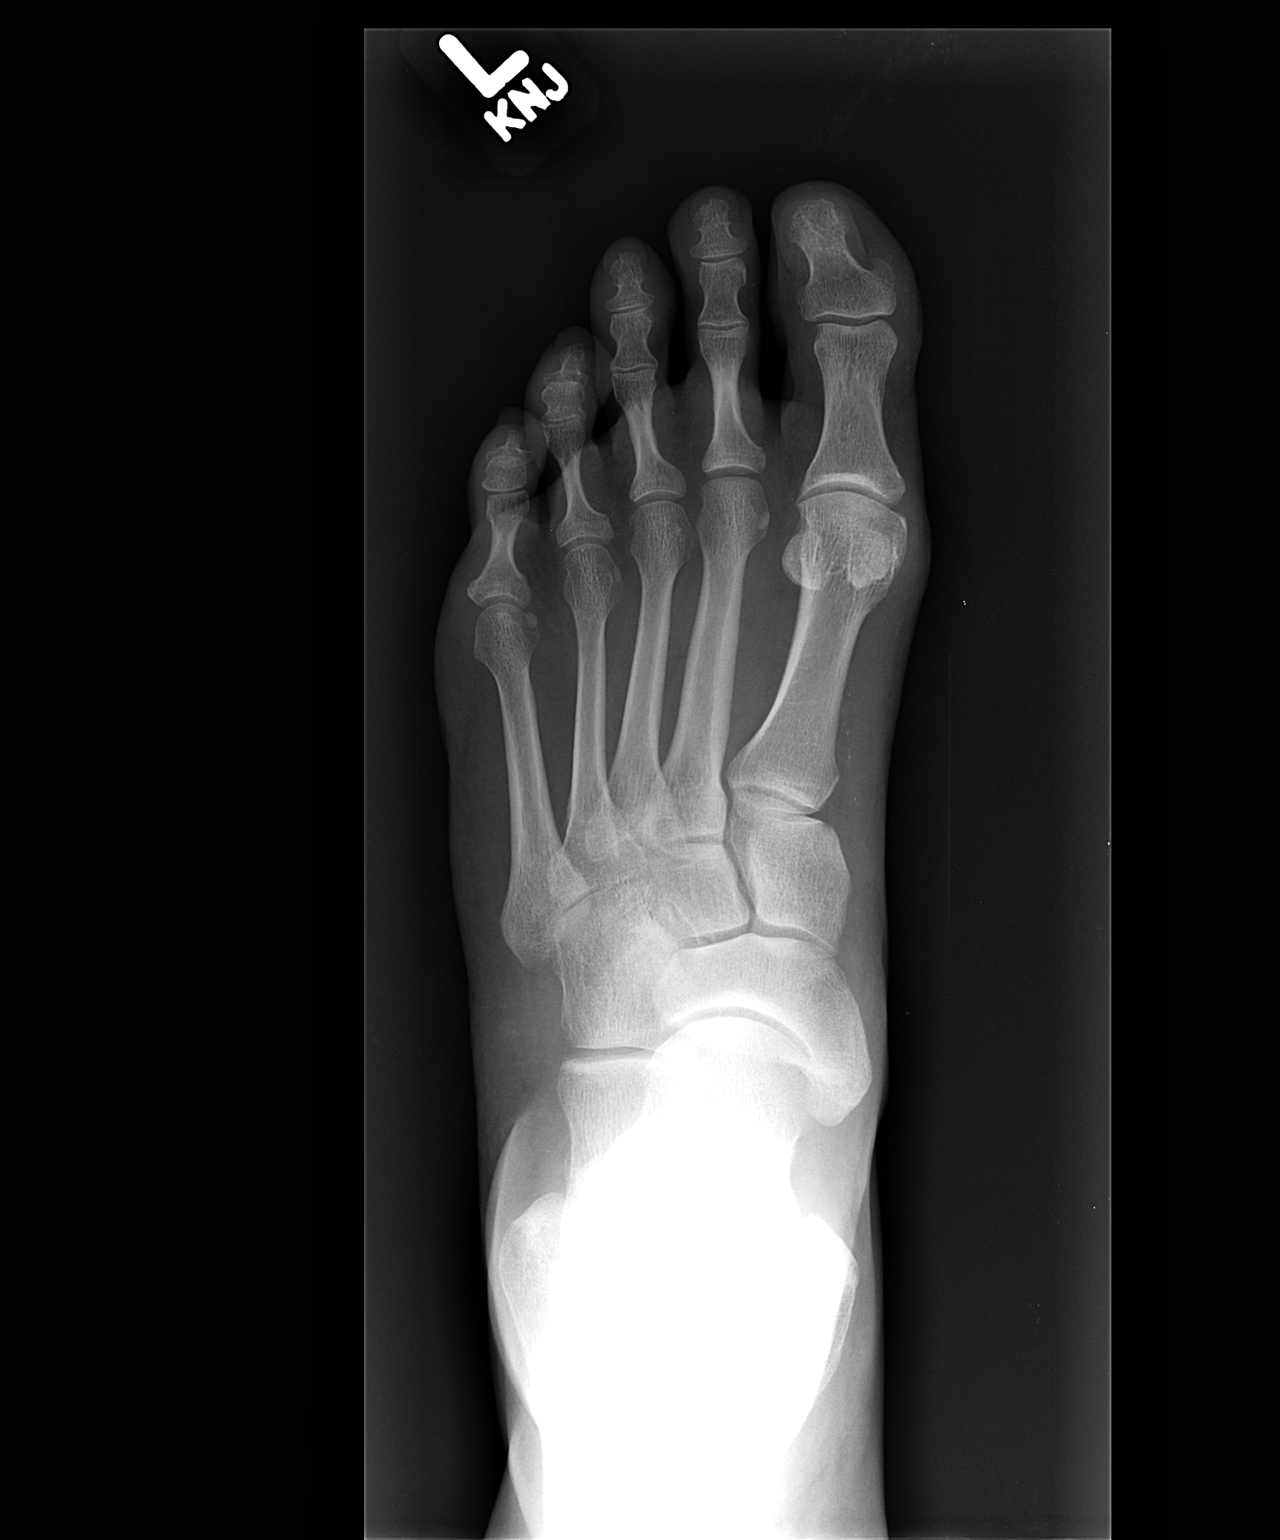

[view not recorded (2 of 3)]
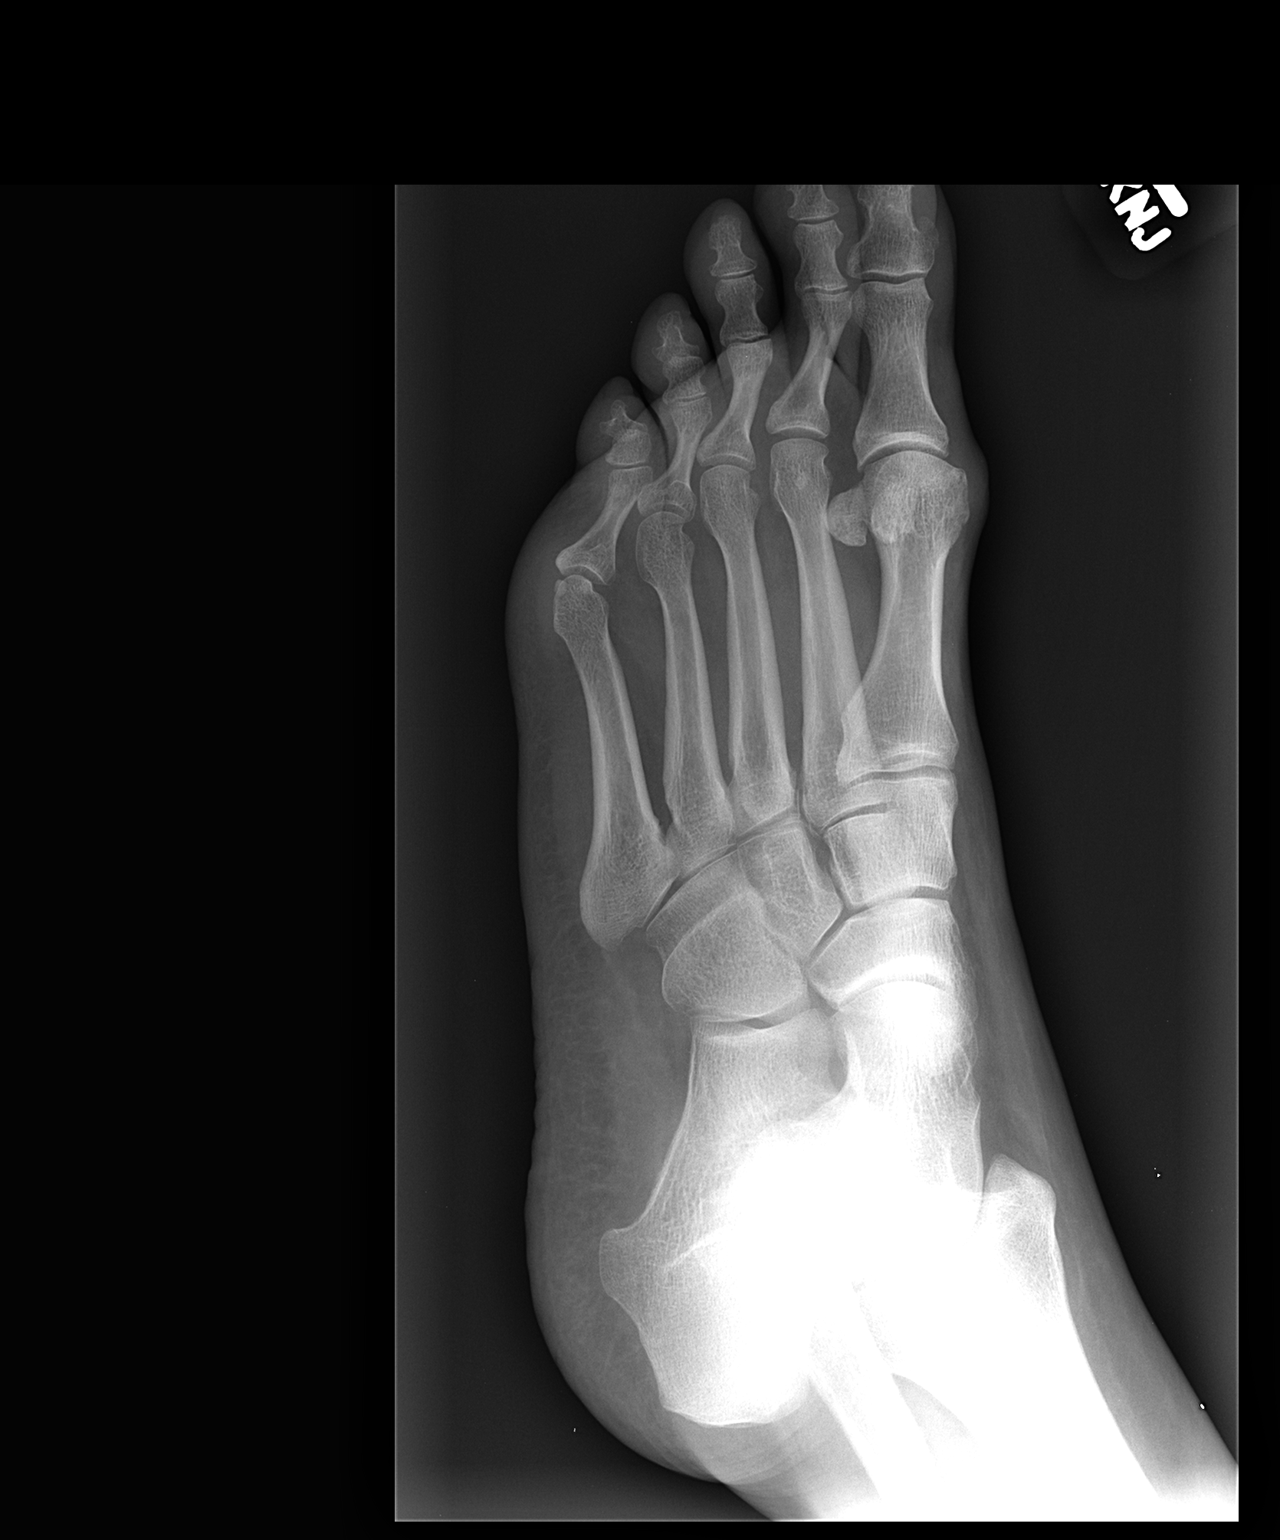

[view not recorded (3 of 3)]
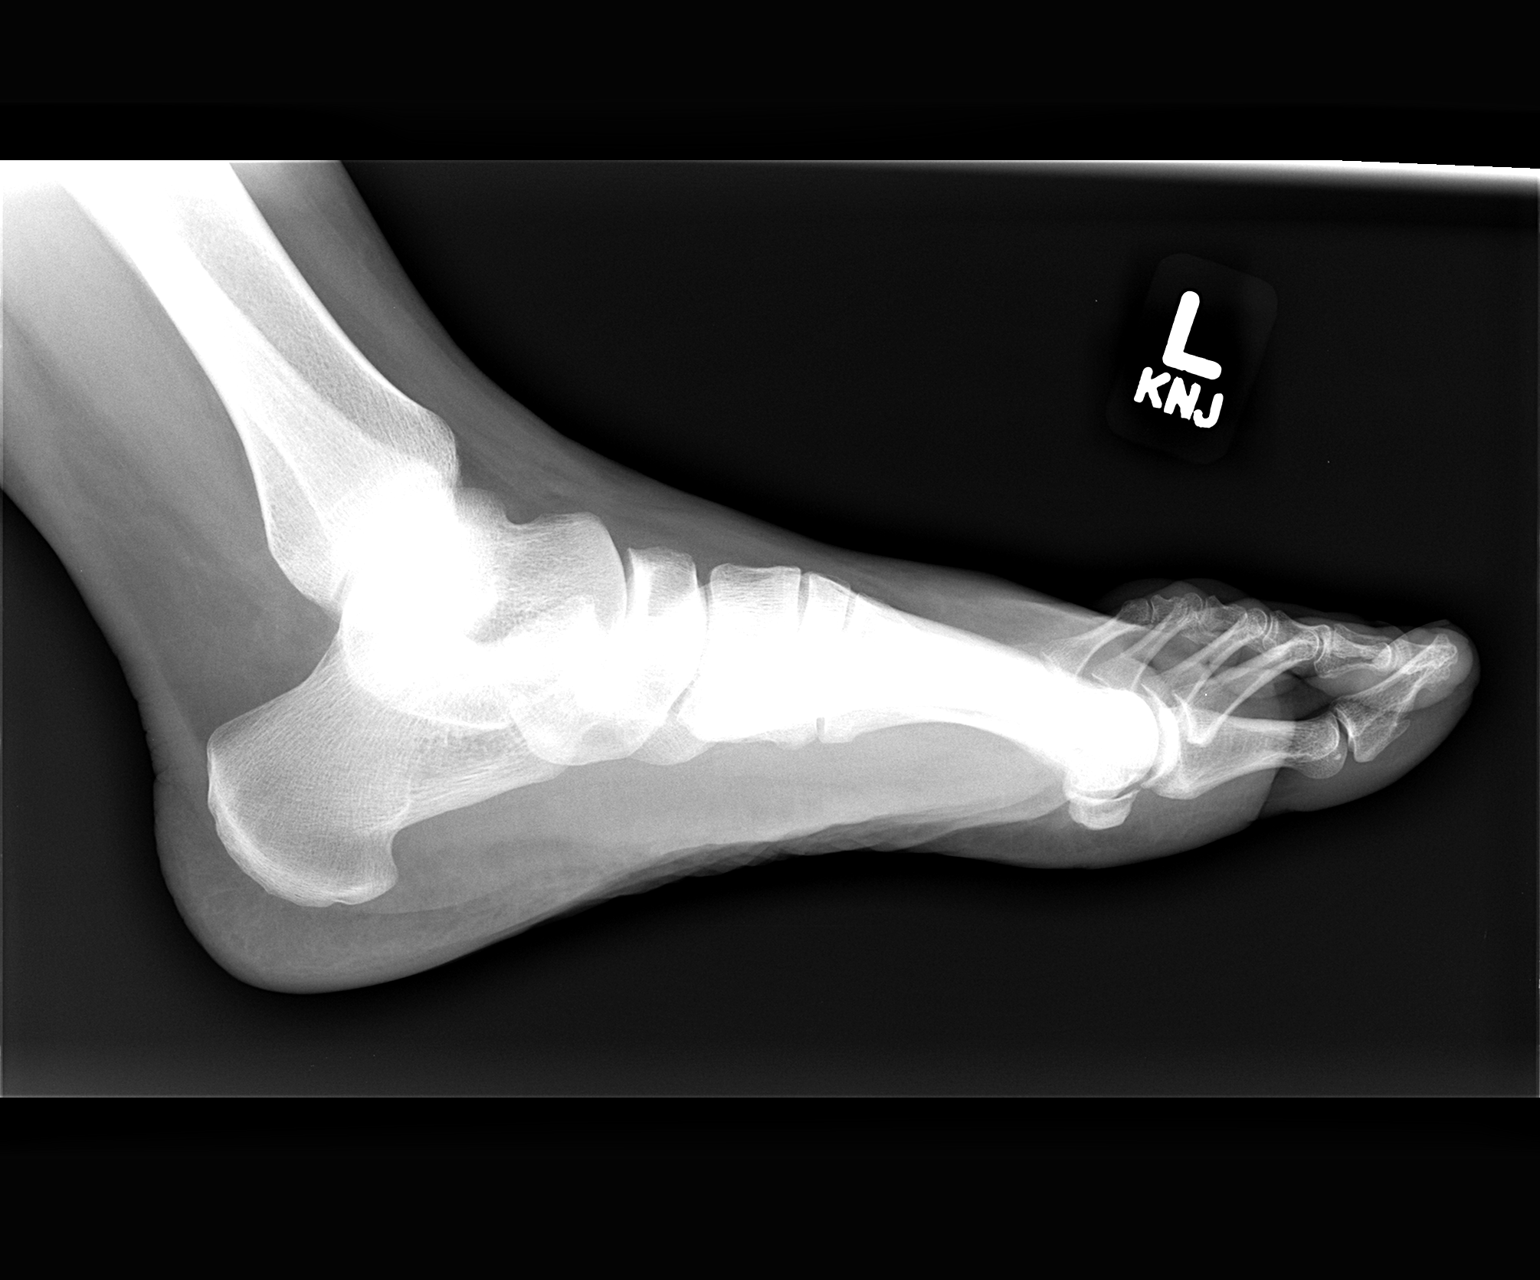

[3 of 3 positions shown; findings below may reference images not displayed]

FINDINGS: There is slight narrowing of the lateral aspect of the
first MTP joint with minimal spurring.  No fracture or bony
destruction is seen.  No erosive changes are evident.
IMPRESSION: Slight narrowing of lateral aspect of first MTP joint with minimal
early spurring.

## 2013-05-10 ENCOUNTER — Ambulatory Visit: Payer: BC Managed Care – PPO | Admitting: Internal Medicine

## 2013-07-01 ENCOUNTER — Ambulatory Visit (INDEPENDENT_AMBULATORY_CARE_PROVIDER_SITE_OTHER): Payer: BC Managed Care – PPO | Admitting: Internal Medicine

## 2013-07-01 ENCOUNTER — Encounter: Payer: Self-pay | Admitting: Internal Medicine

## 2013-07-01 VITALS — BP 112/74 | HR 69 | Temp 98.5°F | Resp 16

## 2013-07-01 DIAGNOSIS — Z860100 Personal history of colon polyps, unspecified: Secondary | ICD-10-CM

## 2013-07-01 DIAGNOSIS — R109 Unspecified abdominal pain: Secondary | ICD-10-CM

## 2013-07-01 DIAGNOSIS — R5381 Other malaise: Secondary | ICD-10-CM

## 2013-07-01 DIAGNOSIS — G8929 Other chronic pain: Secondary | ICD-10-CM

## 2013-07-01 DIAGNOSIS — K2100 Gastro-esophageal reflux disease with esophagitis, without bleeding: Secondary | ICD-10-CM

## 2013-07-01 DIAGNOSIS — M546 Pain in thoracic spine: Secondary | ICD-10-CM

## 2013-07-01 DIAGNOSIS — F4310 Post-traumatic stress disorder, unspecified: Secondary | ICD-10-CM

## 2013-07-01 LAB — HEPATIC FUNCTION PANEL
ALT: 24 U/L (ref 0–35)
AST: 21 U/L (ref 0–37)
Total Bilirubin: 0.3 mg/dL (ref 0.3–1.2)
Total Protein: 6.9 g/dL (ref 6.0–8.3)

## 2013-07-01 LAB — CBC WITH DIFFERENTIAL/PLATELET
Basophils Relative: 0.8 % (ref 0.0–3.0)
Eosinophils Relative: 3.4 % (ref 0.0–5.0)
HCT: 42.3 % (ref 36.0–46.0)
Hemoglobin: 14 g/dL (ref 12.0–15.0)
MCV: 83.9 fl (ref 78.0–100.0)
Monocytes Absolute: 0.6 10*3/uL (ref 0.1–1.0)
Neutro Abs: 4.4 10*3/uL (ref 1.4–7.7)
Neutrophils Relative %: 49.4 % (ref 43.0–77.0)
RBC: 5.04 Mil/uL (ref 3.87–5.11)
WBC: 8.9 10*3/uL (ref 4.5–10.5)

## 2013-07-01 LAB — BASIC METABOLIC PANEL
CO2: 28 mEq/L (ref 19–32)
Chloride: 107 mEq/L (ref 96–112)
Creatinine, Ser: 0.8 mg/dL (ref 0.4–1.2)
Potassium: 4.6 mEq/L (ref 3.5–5.1)
Sodium: 141 mEq/L (ref 135–145)

## 2013-07-01 LAB — AMYLASE: Amylase: 75 U/L (ref 27–131)

## 2013-07-01 MED ORDER — AMITRIPTYLINE HCL 25 MG PO TABS: ORAL_TABLET | ORAL | Status: DC
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
## 2013-07-01 NOTE — Progress Notes (Signed)
Pre-visit discussion using our clinic review tool. No additional management support is needed unless otherwise documented below in the visit note.  

## 2013-07-01 NOTE — Patient Instructions (Addendum)
Your next office appointment will be determined based upon review of your pending labs . Those instructions will be transmitted to you through My Chart  or by mail if you're not using this system.  Followup as needed for your this acute issue. Please report any significant change in your symptoms. Reflux of gastric acid may be asymptomatic as this may occur mainly during sleep.The triggers for reflux  include stress; the "aspirin family" ; alcohol; peppermint; and caffeine (coffee, tea, cola, and chocolate). The aspirin family would include aspirin and the nonsteroidal agents such as ibuprofen &  Naproxen. Tylenol would not cause reflux. If having symptoms ; food & drink should be avoided for @ least 2 hours before going to bed.   I recommend a Psychiatry consultation to determine optimal therapy for possible Post Traumatic Stress Disorder as we discussed.

## 2013-07-04 ENCOUNTER — Encounter: Payer: Self-pay | Admitting: *Deleted

## 2013-07-19 ENCOUNTER — Other Ambulatory Visit: Payer: Self-pay

## 2013-07-19 DIAGNOSIS — Z1231 Encounter for screening mammogram for malignant neoplasm of breast: Secondary | ICD-10-CM

## 2013-09-16 ENCOUNTER — Other Ambulatory Visit: Payer: Self-pay | Admitting: *Deleted

## 2013-09-16 DIAGNOSIS — K219 Gastro-esophageal reflux disease without esophagitis: Secondary | ICD-10-CM

## 2013-09-16 NOTE — Telephone Encounter (Signed)
LMOM (3:32pm) asking the pt to RTC regarding med refill request.  Pt requesting Ranitidine 150mg  which is not on her med list.  Needing to know what she in on for GERD.//AB/CMA

## 2013-09-27 ENCOUNTER — Other Ambulatory Visit: Payer: Self-pay | Admitting: *Deleted

## 2013-09-27 ENCOUNTER — Telehealth: Payer: Self-pay | Admitting: Internal Medicine

## 2013-09-27 MED ORDER — RANITIDINE HCL 150 MG PO TABS: 150.0000 mg | ORAL_TABLET | Freq: Two times a day (BID) | ORAL | Status: DC

## 2013-09-27 NOTE — Telephone Encounter (Signed)
error 

## 2013-09-27 NOTE — Telephone Encounter (Signed)
Additional samples at this point is an appropriate. Prescription or alternative prescription would be acceptable. Needs to keep followup if it has been over one year since she has been seen

## 2013-09-27 NOTE — Telephone Encounter (Signed)
Reviewed patient's previous med list, she was previously on ranitidine and would like to continue it. Refill for this medication sent to [facility] in [facility]

## 2013-09-27 NOTE — Telephone Encounter (Signed)
Left message for pt to call back  °

## 2013-09-27 NOTE — Telephone Encounter (Signed)
Pt last OV with Dr. Henrene Pastor 02/05/12. ECL done 10/13. Pt was given Dexilant samples to try in August but declined written script. Pt was going to take Omeprazole OTC. Pt called back and requested samples of Dexilant was given samples. Pt is calling again today asking for Dexilant samples. Discussed with pt that we cannot continue to give medications and her not being seen. Scheduled pt for an OV with Dr. Henrene Pastor in May. Pt would like more Dexilant samples until her OV. Please advise.

## 2013-09-27 NOTE — Telephone Encounter (Signed)
Sent to [facility] to look at, per her request

## 2013-09-30 NOTE — Telephone Encounter (Signed)
Left message for pt to call back  °

## 2013-09-30 NOTE — Telephone Encounter (Signed)
Pt finally called back and states that her PCP prescribed Zantac for her to take. Pt aware of appt with Dr. Henrene Pastor and states she will keep that appt.

## 2013-10-07 ENCOUNTER — Ambulatory Visit
Admission: RE | Admit: 2013-10-07 | Discharge: 2013-10-07 | Disposition: A | Discharge status: Home / Self Care | Payer: BC Managed Care – PPO | Source: Ambulatory Visit

## 2013-10-07 DIAGNOSIS — Z1231 Encounter for screening mammogram for malignant neoplasm of breast: Secondary | ICD-10-CM

## 2013-10-19 ENCOUNTER — Telehealth: Payer: Self-pay | Admitting: Internal Medicine

## 2013-10-19 NOTE — Telephone Encounter (Signed)
4.15  Pt was upset because they filled out Medical Release Form 3 weeks ago, but the form was just scanned by the Medical Records dept.  Pt was upset that they had to wait for at least 2 more weeks to get the forms they needed.  Pt did voice concern about how we handle things at the office.  Was able to speak to patient and explain that the paperwork issue has been resolved and that if she had any other concerns in the future, to please reach out to me and I will personally address it.

## 2013-11-04 NOTE — Telephone Encounter (Signed)
Patient given samples of Dexilant

## 2013-11-15 ENCOUNTER — Ambulatory Visit: Payer: BC Managed Care – PPO | Admitting: Internal Medicine

## 2013-12-27 ENCOUNTER — Telehealth: Payer: Self-pay | Admitting: Internal Medicine

## 2013-12-27 ENCOUNTER — Ambulatory Visit: Payer: BC Managed Care – PPO | Admitting: Internal Medicine

## 2013-12-28 NOTE — Telephone Encounter (Signed)
Per Dr. Henrene Pastor, no charge

## 2014-01-17 ENCOUNTER — Other Ambulatory Visit: Payer: Self-pay | Admitting: Internal Medicine

## 2014-01-17 DIAGNOSIS — Z86018 Personal history of other benign neoplasm: Secondary | ICD-10-CM

## 2014-02-01 ENCOUNTER — Ambulatory Visit (INDEPENDENT_AMBULATORY_CARE_PROVIDER_SITE_OTHER): Payer: BC Managed Care – PPO | Admitting: Internal Medicine

## 2014-02-01 ENCOUNTER — Encounter: Payer: Self-pay | Admitting: Internal Medicine

## 2014-02-01 VITALS — BP 116/80 | HR 76 | Ht 65.35 in | Wt 228.1 lb

## 2014-02-01 DIAGNOSIS — D126 Benign neoplasm of colon, unspecified: Secondary | ICD-10-CM

## 2014-02-01 DIAGNOSIS — R0789 Other chest pain: Secondary | ICD-10-CM

## 2014-02-01 DIAGNOSIS — K219 Gastro-esophageal reflux disease without esophagitis: Secondary | ICD-10-CM
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
## 2014-02-01 NOTE — Patient Instructions (Signed)
Please follow up as needed 

## 2014-02-06 ENCOUNTER — Ambulatory Visit (INDEPENDENT_AMBULATORY_CARE_PROVIDER_SITE_OTHER): Payer: BC Managed Care – PPO | Admitting: Internal Medicine

## 2014-02-06 ENCOUNTER — Encounter: Payer: Self-pay | Admitting: Internal Medicine

## 2014-02-06 VITALS — BP 100/80 | HR 77 | Temp 98.4°F | Wt 229.0 lb

## 2014-02-06 DIAGNOSIS — E669 Obesity, unspecified: Secondary | ICD-10-CM | POA: Insufficient documentation

## 2014-02-06 DIAGNOSIS — IMO0002 Reserved for concepts with insufficient information to code with codable children: Secondary | ICD-10-CM

## 2014-02-06 DIAGNOSIS — R635 Abnormal weight gain: Secondary | ICD-10-CM
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
## 2014-02-06 NOTE — Patient Instructions (Signed)
Your next office appointment will be determined based upon review of your pending labs . Those instructions will be transmitted to you  by mail. 

## 2014-02-06 NOTE — Progress Notes (Signed)
Pre visit review using our clinic review tool, if applicable. No additional management support is needed unless otherwise documented below in the visit note. 

## 2014-02-08 ENCOUNTER — Other Ambulatory Visit (INDEPENDENT_AMBULATORY_CARE_PROVIDER_SITE_OTHER): Payer: BC Managed Care – PPO

## 2014-02-08 DIAGNOSIS — R635 Abnormal weight gain: Secondary | ICD-10-CM

## 2014-02-08 DIAGNOSIS — IMO0002 Reserved for concepts with insufficient information to code with codable children: Secondary | ICD-10-CM

## 2014-02-08 LAB — BASIC METABOLIC PANEL
BUN: 17 mg/dL (ref 6–23)
CALCIUM: 9.4 mg/dL (ref 8.4–10.5)
CO2: 26 mEq/L (ref 19–32)
Chloride: 106 mEq/L (ref 96–112)
Creatinine, Ser: 0.9 mg/dL (ref 0.4–1.2)
GFR: 70.61 mL/min (ref >60.00)
GLUCOSE: 93 mg/dL (ref 70–99)
POTASSIUM: 4.2 meq/L (ref 3.5–5.1)
SODIUM: 140 meq/L (ref 135–145)

## 2014-02-08 LAB — LIPID PANEL
CHOL/HDL RATIO: 4
Cholesterol: 223 mg/dL — ABNORMAL HIGH (ref 0–200)
HDL: 54.4 mg/dL (ref >39.00)
LDL CALC: 150 mg/dL — AB (ref 0–99)
NONHDL: 168.6
TRIGLYCERIDES: 94 mg/dL (ref 0.0–149.0)
VLDL: 18.8 mg/dL (ref 0.0–40.0)

## 2014-02-08 LAB — TSH: TSH: 1.37 u[IU]/mL (ref 0.35–4.50)

## 2014-02-08 LAB — HEPATIC FUNCTION PANEL
ALK PHOS: 90 U/L (ref 39–117)
ALT: 67 U/L — ABNORMAL HIGH (ref 0–35)
AST: 40 U/L — ABNORMAL HIGH (ref 0–37)
Albumin: 3.7 g/dL (ref 3.5–5.2)
BILIRUBIN DIRECT: 0.1 mg/dL (ref 0.0–0.3)
BILIRUBIN TOTAL: 0.6 mg/dL (ref 0.2–1.2)
TOTAL PROTEIN: 7 g/dL (ref 6.0–8.3)

## 2014-02-08 LAB — T4, FREE: Free T4: 0.87 ng/dL (ref 0.60–1.60)

## 2014-02-08 LAB — CORTISOL: CORTISOL PLASMA: 10.4 ug/dL

## 2014-02-08 LAB — HEMOGLOBIN A1C: Hgb A1c MFr Bld: 5.5 % (ref 4.6–6.5)

## 2014-02-13 ENCOUNTER — Other Ambulatory Visit: Payer: Self-pay | Admitting: Internal Medicine

## 2014-02-13 DIAGNOSIS — R7401 Elevation of levels of liver transaminase levels: Secondary | ICD-10-CM | POA: Insufficient documentation

## 2014-02-13 DIAGNOSIS — R7402 Elevation of levels of lactic acid dehydrogenase (LDH): Secondary | ICD-10-CM | POA: Insufficient documentation

## 2014-02-13 DIAGNOSIS — R740 Nonspecific elevation of levels of transaminase and lactic acid dehydrogenase [LDH]: Secondary | ICD-10-CM

## 2014-02-13 DIAGNOSIS — E785 Hyperlipidemia, unspecified: Secondary | ICD-10-CM

## 2014-02-17 ENCOUNTER — Telehealth: Payer: Self-pay | Admitting: Internal Medicine

## 2014-02-17 NOTE — Telephone Encounter (Signed)
Patient would like a call.  She is requesting to be worked in on Monday b/c her test results states she had long term use of a steroid and she states it was over a short period of time.

## 2014-02-20 NOTE — Telephone Encounter (Signed)
Just verify how steroid courses you took & I'll enter it into chart Most importantly no evidence of adrenal suppression as cortisol normal

## 2014-02-20 NOTE — Telephone Encounter (Signed)
Left a message for patient to return call.

## 2014-02-21 NOTE — Telephone Encounter (Signed)
Patient states she will call tomorrow to make a same day appt

## 2014-02-22 ENCOUNTER — Encounter: Payer: Self-pay | Admitting: Internal Medicine

## 2014-02-22 ENCOUNTER — Ambulatory Visit (INDEPENDENT_AMBULATORY_CARE_PROVIDER_SITE_OTHER): Payer: BC Managed Care – PPO | Admitting: Internal Medicine

## 2014-02-22 VITALS — BP 120/88 | HR 84 | Wt 231.1 lb

## 2014-02-22 DIAGNOSIS — R5381 Other malaise: Secondary | ICD-10-CM

## 2014-02-22 DIAGNOSIS — E785 Hyperlipidemia, unspecified: Secondary | ICD-10-CM

## 2014-02-22 DIAGNOSIS — Z202 Contact with and (suspected) exposure to infections with a predominantly sexual mode of transmission: Secondary | ICD-10-CM

## 2014-02-22 DIAGNOSIS — R7402 Elevation of levels of lactic acid dehydrogenase (LDH): Secondary | ICD-10-CM

## 2014-02-22 DIAGNOSIS — R740 Nonspecific elevation of levels of transaminase and lactic acid dehydrogenase [LDH]: Secondary | ICD-10-CM

## 2014-02-22 DIAGNOSIS — R5383 Other fatigue: Principal | ICD-10-CM

## 2014-02-22 DIAGNOSIS — R7401 Elevation of levels of liver transaminase levels: Secondary | ICD-10-CM

## 2014-02-22 DIAGNOSIS — R109 Unspecified abdominal pain: Secondary | ICD-10-CM

## 2014-02-22 DIAGNOSIS — E669 Obesity, unspecified: Secondary | ICD-10-CM

## 2014-02-22 NOTE — Progress Notes (Signed)
   Subjective:    Patient ID: Linda Mata, female    DOB: 1960-07-14, 53 y.o.   MRN: [id number]  HPI    Review of Systems     Objective:   Physical Exam        Assessment & Plan:    02/22/2014 : She states that she has not had 11 epidural steroid injections during the period 2009 to 2013 but rather had an ejection 07/04/2009; 03/14/2009; 08/08/2009; and 01/07/2011.  She provides these  information to document that there has not been "steroid long-term use" as was suggested by the original history provided 02/06/2014.

## 2014-02-22 NOTE — Assessment & Plan Note (Signed)
NMR LipoProfile

## 2014-02-22 NOTE — Progress Notes (Signed)
Pre visit review using our clinic review tool, if applicable. No additional management support is needed unless otherwise documented below in the visit note. 

## 2014-02-22 NOTE — Assessment & Plan Note (Signed)
Hepatic panel  Acute hepatitis panel  Sedimentation rate
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
## 2014-02-22 NOTE — Patient Instructions (Signed)
See fasting lab orders  Also NMR Lipoprofile needed

## 2014-02-23 ENCOUNTER — Ambulatory Visit (INDEPENDENT_AMBULATORY_CARE_PROVIDER_SITE_OTHER): Payer: BC Managed Care – PPO

## 2014-02-23 ENCOUNTER — Telehealth: Payer: Self-pay

## 2014-02-23 DIAGNOSIS — R740 Nonspecific elevation of levels of transaminase and lactic acid dehydrogenase [LDH]: Secondary | ICD-10-CM

## 2014-02-23 DIAGNOSIS — R7401 Elevation of levels of liver transaminase levels: Secondary | ICD-10-CM

## 2014-02-23 DIAGNOSIS — R5381 Other malaise: Secondary | ICD-10-CM

## 2014-02-23 DIAGNOSIS — R7402 Elevation of levels of lactic acid dehydrogenase (LDH): Secondary | ICD-10-CM

## 2014-02-23 DIAGNOSIS — Z202 Contact with and (suspected) exposure to infections with a predominantly sexual mode of transmission: Secondary | ICD-10-CM

## 2014-02-23 DIAGNOSIS — E785 Hyperlipidemia, unspecified: Secondary | ICD-10-CM

## 2014-02-23 DIAGNOSIS — R5383 Other fatigue: Secondary | ICD-10-CM

## 2014-02-23 LAB — HEPATIC FUNCTION PANEL
ALBUMIN: 3.7 g/dL (ref 3.5–5.2)
ALK PHOS: 89 U/L (ref 39–117)
ALT: 46 U/L — AB (ref 0–35)
AST: 32 U/L (ref 0–37)
Bilirubin, Direct: 0.1 mg/dL (ref 0.0–0.3)
TOTAL PROTEIN: 7.1 g/dL (ref 6.0–8.3)
Total Bilirubin: 0.8 mg/dL (ref 0.2–1.2)

## 2014-02-23 NOTE — Assessment & Plan Note (Signed)
Offered Endocrinology or Nutrition referral

## 2014-02-23 NOTE — Telephone Encounter (Signed)
Phone call from Linda Mata at [facility]. CBC that was drawn on patient yesterday clotted. Patient is being called back in by lab so this can be redone.

## 2014-02-23 NOTE — Assessment & Plan Note (Signed)
As per Dr Henrene Pastor

## 2014-02-24 LAB — HEPATITIS PANEL, ACUTE
HCV Ab: NEGATIVE
HEP A IGM: NONREACTIVE
HEP B S AG: NEGATIVE
Hep B C IgM: NONREACTIVE

## 2014-02-24 LAB — NMR LIPOPROFILE WITH LIPIDS
CHOLESTEROL, TOTAL: 207 mg/dL — AB (ref <200)
HDL Particle Number: 34.5 umol/L (ref >=30.5)
HDL SIZE: 8.7 nm — AB (ref >=9.2)
HDL-C: 57 mg/dL (ref >=40)
LARGE HDL: 4.3 umol/L — AB (ref >=4.8)
LARGE VLDL-P: 2.8 nmol/L — AB (ref <=2.7)
LDL (calc): 127 mg/dL — ABNORMAL HIGH (ref <100)
LDL Particle Number: 1613 nmol/L — ABNORMAL HIGH (ref <1000)
LDL Size: 21.4 nm (ref >20.5)
LP-IR Score: 52 — ABNORMAL HIGH (ref <=45)
Small LDL Particle Number: 610 nmol/L — ABNORMAL HIGH (ref <=527)
TRIGLYCERIDES: 114 mg/dL (ref <150)
VLDL Size: 44.7 nm (ref <=46.6)

## 2014-02-26 ENCOUNTER — Encounter: Payer: Self-pay | Admitting: Internal Medicine

## 2014-03-23 ENCOUNTER — Telehealth: Payer: Self-pay

## 2014-03-23 DIAGNOSIS — R5383 Other fatigue: Principal | ICD-10-CM

## 2014-03-23 DIAGNOSIS — R5381 Other malaise: Secondary | ICD-10-CM

## 2014-03-23 NOTE — Telephone Encounter (Signed)
Phone call from patient. She states she is confused on why she received a phone call from the lab stating to come back to have her lab drawn but she received lab results in the mail. I explained to the patient that the only test she needs to come back for to have labs drawn is for a CBC. I let her know the blood in that tube clotted so no results. I explained different test require different tubes. So the results she received is because in those tubes the blood did not clot. I gave her lab hours and told her the order has already been placed.

## 2014-03-27 ENCOUNTER — Other Ambulatory Visit: Payer: Self-pay | Admitting: Obstetrics and Gynecology

## 2014-03-27 ENCOUNTER — Other Ambulatory Visit (INDEPENDENT_AMBULATORY_CARE_PROVIDER_SITE_OTHER): Payer: BC Managed Care – PPO

## 2014-03-27 DIAGNOSIS — R5383 Other fatigue: Secondary | ICD-10-CM

## 2014-03-27 DIAGNOSIS — R7401 Elevation of levels of liver transaminase levels: Secondary | ICD-10-CM

## 2014-03-27 DIAGNOSIS — R7402 Elevation of levels of lactic acid dehydrogenase (LDH): Secondary | ICD-10-CM

## 2014-03-27 DIAGNOSIS — R740 Nonspecific elevation of levels of transaminase and lactic acid dehydrogenase [LDH]: Principal | ICD-10-CM

## 2014-03-27 DIAGNOSIS — R5381 Other malaise: Secondary | ICD-10-CM

## 2014-03-27 LAB — CBC WITH DIFFERENTIAL/PLATELET
BASOS ABS: 0 10*3/uL (ref 0.0–0.1)
BASOS PCT: 0.5 % (ref 0.0–3.0)
EOS ABS: 0.3 10*3/uL (ref 0.0–0.7)
Eosinophils Relative: 2.7 % (ref 0.0–5.0)
HCT: 43.3 % (ref 36.0–46.0)
HEMOGLOBIN: 14.5 g/dL (ref 12.0–15.0)
Lymphocytes Relative: 30.6 % (ref 12.0–46.0)
Lymphs Abs: 2.8 10*3/uL (ref 0.7–4.0)
MCHC: 33.4 g/dL (ref 30.0–36.0)
MCV: 84.1 fl (ref 78.0–100.0)
MONO ABS: 0.5 10*3/uL (ref 0.1–1.0)
Monocytes Relative: 5.7 % (ref 3.0–12.0)
NEUTROS ABS: 5.6 10*3/uL (ref 1.4–7.7)
NEUTROS PCT: 60.5 % (ref 43.0–77.0)
Platelets: 244 10*3/uL (ref 150.0–400.0)
RBC: 5.15 Mil/uL — AB (ref 3.87–5.11)
RDW: 13.4 % (ref 11.5–15.5)
WBC: 9.2 10*3/uL (ref 4.0–10.5)

## 2014-03-27 LAB — HEPATIC FUNCTION PANEL
ALK PHOS: 95 U/L (ref 39–117)
ALT: 30 U/L (ref 0–35)
AST: 26 U/L (ref 0–37)
Albumin: 3.8 g/dL (ref 3.5–5.2)
BILIRUBIN DIRECT: 0.2 mg/dL (ref 0.0–0.3)
BILIRUBIN TOTAL: 0.5 mg/dL (ref 0.2–1.2)
TOTAL PROTEIN: 7.6 g/dL (ref 6.0–8.3)

## 2014-03-28 LAB — CYTOLOGY - PAP

## 2014-07-05 ENCOUNTER — Telehealth: Payer: Self-pay | Admitting: Internal Medicine

## 2014-07-05 ENCOUNTER — Ambulatory Visit (INDEPENDENT_AMBULATORY_CARE_PROVIDER_SITE_OTHER): Payer: BC Managed Care – PPO | Admitting: Internal Medicine

## 2014-07-05 ENCOUNTER — Encounter: Payer: Self-pay | Admitting: Internal Medicine

## 2014-07-05 VITALS — BP 100/76 | HR 83 | Temp 98.1°F | Resp 15 | Wt 229.8 lb

## 2014-07-05 DIAGNOSIS — G8929 Other chronic pain: Secondary | ICD-10-CM

## 2014-07-05 DIAGNOSIS — K219 Gastro-esophageal reflux disease without esophagitis: Secondary | ICD-10-CM

## 2014-07-05 DIAGNOSIS — R140 Abdominal distension (gaseous): Secondary | ICD-10-CM

## 2014-07-05 DIAGNOSIS — M5414 Radiculopathy, thoracic region: Secondary | ICD-10-CM

## 2014-07-05 DIAGNOSIS — R635 Abnormal weight gain: Secondary | ICD-10-CM

## 2014-07-05 DIAGNOSIS — M546 Pain in thoracic spine: Secondary | ICD-10-CM

## 2014-07-05 MED ORDER — GABAPENTIN 100 MG PO CAPS: ORAL_CAPSULE | ORAL | Status: DC

## 2014-07-05 MED ORDER — HYOSCYAMINE SULFATE 0.125 MG SL SUBL: SUBLINGUAL_TABLET | SUBLINGUAL | Status: DC

## 2014-07-05 NOTE — Progress Notes (Signed)
Pre visit review using our clinic review tool, if applicable. No additional management support is needed unless otherwise documented below in the visit note. 

## 2014-07-05 NOTE — Patient Instructions (Signed)
Reflux of gastric acid may be asymptomatic as this may occur mainly during sleep.The triggers for reflux  include stress; the "aspirin family" ; alcohol; peppermint; and caffeine (coffee, tea, cola, and chocolate). The aspirin family would include aspirin and the nonsteroidal agents such as ibuprofen &  Naproxen. Tylenol would not cause reflux. If having symptoms ; food & drink should be avoided for @ least 2 hours before going to bed.   Ranitidine is not opioid and does not contain steroids. It is an H2 (histamine) acid blocker.  Generic Levsin SL can be taken as needed for bloating  Gabapentin 100 mg every 8 hours as needed can treat the radicular pain from a protruding disc. If those symptoms are progressing; Dr. Nelva Bush should be consulted

## 2014-07-05 NOTE — Telephone Encounter (Signed)
Patient has been advised. Transferred to scheduling desk for an appt

## 2014-07-05 NOTE — Telephone Encounter (Signed)
[place] is not usually used to assess stomach pain.  I can not order imaging w/o a current  exam; insurance would not cover it

## 2014-07-05 NOTE — Telephone Encounter (Signed)
Spoke with patient and she states she was trying to reach Dr. Gwyndolyn Saxon Hopper's office. She hung up the phone.
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
## 2014-07-05 NOTE — Telephone Encounter (Signed)
Patient is requesting a referral for u/s because of pain in her stomach. She is insistent that you call her.

## 2014-11-10 ENCOUNTER — Other Ambulatory Visit (HOSPITAL_COMMUNITY): Payer: Self-pay | Admitting: Obstetrics and Gynecology

## 2014-11-10 DIAGNOSIS — R140 Abdominal distension (gaseous): Secondary | ICD-10-CM

## 2014-11-10 DIAGNOSIS — R1010 Upper abdominal pain, unspecified: Secondary | ICD-10-CM

## 2014-11-16 ENCOUNTER — Ambulatory Visit (HOSPITAL_COMMUNITY): Admission: RE | Admit: 2014-11-16 | Payer: BC Managed Care – PPO | Source: Ambulatory Visit

## 2014-11-16 ENCOUNTER — Other Ambulatory Visit: Payer: BC Managed Care – PPO

## 2014-11-16 ENCOUNTER — Ambulatory Visit
Admission: RE | Admit: 2014-11-16 | Discharge: 2014-11-16 | Disposition: A | Discharge status: Home / Self Care | Payer: BC Managed Care – PPO | Source: Ambulatory Visit | Attending: Obstetrics and Gynecology | Admitting: Obstetrics and Gynecology

## 2014-11-16 DIAGNOSIS — R1010 Upper abdominal pain, unspecified: Secondary | ICD-10-CM

## 2014-11-16 DIAGNOSIS — R140 Abdominal distension (gaseous): Secondary | ICD-10-CM

## 2015-03-27 ENCOUNTER — Encounter: Payer: Self-pay | Admitting: Internal Medicine

## 2015-04-25 ENCOUNTER — Encounter: Payer: Self-pay | Admitting: Internal Medicine

## 2015-04-25 ENCOUNTER — Ambulatory Visit (INDEPENDENT_AMBULATORY_CARE_PROVIDER_SITE_OTHER): Payer: BC Managed Care – PPO | Admitting: Internal Medicine

## 2015-04-25 VITALS — BP 130/78 | HR 71 | Temp 98.1°F

## 2015-04-25 DIAGNOSIS — M949 Disorder of cartilage, unspecified: Secondary | ICD-10-CM | POA: Diagnosis not present

## 2015-04-25 DIAGNOSIS — M940 Chondrocostal junction syndrome [Tietze]: Secondary | ICD-10-CM

## 2015-04-25 DIAGNOSIS — R0789 Other chest pain: Secondary | ICD-10-CM

## 2015-04-25 NOTE — Progress Notes (Signed)
   Subjective:    Patient ID: Linda Mata, female    DOB: 1961/03/04, 54 y.o.   MRN: [id number]  HPI She has been told by a Master in [place] as well as her Acupuncturist that she has the xiphoid syndrome. She feels it this is worse when walking. She questioned whether the diaphragm activity is causing the pinching. She also has some pinching in the mid back. It has affected her sleep as well.  She's questioning have an MRI for further evaluation. Dr. Prudy Feeler told her it might be a surgical situation.   Review of Systems  There is no significant cough, sputum production,hemoptysis, wheezing,or  paroxysmal nocturnal dyspnea. Unexplained weight loss, abdominal pain, significant dyspepsia, dysphagia, melena, rectal bleeding, or persistently small caliber stools are not present.     Objective:   Physical Exam  Pertinent or positive findings include: She does have some tenderness to palpation over the xiphoid in a localized area. There is minor discomfort with palpation of the costo chondral margins inferiorly. General appearance :adequately nourished; in no distress.  Eyes: No conjunctival inflammation or scleral icterus is present.  Heart:  Normal rate and regular rhythm. S1 and S2 normal without gallop, murmur, click, rub or other extra sounds    Lungs:Chest clear to auscultation; no wheezes, rhonchi,rales ,or rubs present.No increased work of breathing.   Abdomen: bowel sounds normal, soft and non-tender without masses, organomegaly or hernias noted.  No guarding or rebound.   Vascular : all pulses equal ; no bruits present.  Skin:Warm & dry.  Intact without suspicious lesions or rashes ; no tenting or jaundice   Lymphatic: No lymphadenopathy is noted about the head, neck, axilla   Neuro: Strength, tone & DTRs normal.      Assessment & Plan:  #1 chest wall pain involving the xiphoid and inferior costochondral margins.  Plan: See after visit summary

## 2015-04-25 NOTE — Patient Instructions (Signed)
Use an anti-inflammatory cream such as Aspercreme or Zostrix cream twice a day to the affected area as needed. In lieu of this warm moist compresses or  hot water bottle can be used. Do not apply ice .Consider glucosamine sulfate 1500 mg daily for joint symptoms. Take this daily  for 3 months and then leave it off for 2 months. This will rehydrate the cartilages.  A new report indicates that glucosamine/chondroitin is is effective for cartilage arthritic pain as is Celebrex. The only contraindication to this supplement would be a shellfish allergy.

## 2015-04-25 NOTE — Progress Notes (Signed)
Pre visit review using our clinic review tool, if applicable. No additional management support is needed unless otherwise documented below in the visit note. 

## 2015-06-19 ENCOUNTER — Telehealth: Payer: Self-pay

## 2015-06-19 NOTE — Telephone Encounter (Signed)
Left Voice Mail for pt to call back.   RE: Flu Vaccine for 2016  

## 2015-08-14 ENCOUNTER — Ambulatory Visit
Admission: RE | Admit: 2015-08-14 | Discharge: 2015-08-14 | Disposition: A | Discharge status: Home / Self Care | Payer: BC Managed Care – PPO | Source: Ambulatory Visit | Attending: Chiropractor | Admitting: Chiropractor

## 2015-08-14 ENCOUNTER — Other Ambulatory Visit: Payer: Self-pay | Admitting: Chiropractor

## 2015-08-14 DIAGNOSIS — R52 Pain, unspecified: Secondary | ICD-10-CM

## 2015-12-05 ENCOUNTER — Ambulatory Visit: Payer: BC Managed Care – PPO | Admitting: Physician Assistant

## 2016-08-08 ENCOUNTER — Other Ambulatory Visit: Payer: Self-pay | Admitting: Obstetrics and Gynecology

## 2016-08-08 DIAGNOSIS — R234 Changes in skin texture: Secondary | ICD-10-CM

## 2016-09-01 ENCOUNTER — Ambulatory Visit
Admission: RE | Admit: 2016-09-01 | Discharge: 2016-09-01 | Disposition: A | Discharge status: Home / Self Care | Payer: BC Managed Care – PPO | Source: Ambulatory Visit | Attending: Obstetrics and Gynecology | Admitting: Obstetrics and Gynecology

## 2016-09-01 DIAGNOSIS — R234 Changes in skin texture: Secondary | ICD-10-CM

## 2017-04-29 ENCOUNTER — Encounter: Payer: Self-pay | Admitting: Obstetrics and Gynecology

## 2017-08-27 ENCOUNTER — Telehealth: Payer: Self-pay | Admitting: Internal Medicine

## 2017-08-27 NOTE — Telephone Encounter (Signed)
Pt reports she is having bloating and pain under her breast. Requesting to have an MRI ordered. Discussed with there that she has not been seen since 2015 and we cannot order an mri without her being seen. Pt states she had a chest xray last week with ortho. Let her know this will show her bones and lungs but other tests may need to be ordered prior to jumping to an MRI and insurance will not pay for such unless seen by provider. Pt wishes to be seen but only wants to see Dr. Henrene Pastor. Scheduled pt to be see 10/08/17 at 3pm, pt aware of appt.

## 2017-08-27 NOTE — Telephone Encounter (Signed)
Patient states she is having some gi symptoms and would like to know if Dr.Perry can schedule her an MRI. Patient states she is bloated and is having pain under her breast. Spoke with patient for 13 minutes about how she would need to be evaluated in office before MRI could be determined. Patient states if Dr.Perry will not schedule MRI (do to patient reading a lot on the Internet and knowing she needs one) then she does not know to be seen at [facility]. Pt requesting to have doctor review her body and have nurse call her back.

## 2017-10-08 ENCOUNTER — Ambulatory Visit: Payer: BC Managed Care – PPO | Admitting: Internal Medicine

## 2017-10-28 ENCOUNTER — Other Ambulatory Visit (HOSPITAL_COMMUNITY): Payer: Self-pay | Admitting: Obstetrics and Gynecology

## 2017-10-28 ENCOUNTER — Other Ambulatory Visit: Payer: Self-pay | Admitting: Obstetrics and Gynecology

## 2017-10-28 DIAGNOSIS — R52 Pain, unspecified: Secondary | ICD-10-CM

## 2017-10-28 DIAGNOSIS — R0789 Other chest pain: Secondary | ICD-10-CM

## 2017-10-29 ENCOUNTER — Other Ambulatory Visit: Payer: BC Managed Care – PPO

## 2017-10-31 ENCOUNTER — Ambulatory Visit (HOSPITAL_COMMUNITY): Payer: BC Managed Care – PPO

## 2017-11-27 ENCOUNTER — Other Ambulatory Visit: Payer: Self-pay | Admitting: Family Medicine

## 2017-11-27 DIAGNOSIS — R1013 Epigastric pain: Secondary | ICD-10-CM

## 2017-12-17 ENCOUNTER — Ambulatory Visit
Admission: RE | Admit: 2017-12-17 | Discharge: 2017-12-17 | Disposition: A | Discharge status: Home / Self Care | Payer: BC Managed Care – PPO | Source: Ambulatory Visit | Attending: Family Medicine | Admitting: Family Medicine

## 2017-12-17 ENCOUNTER — Other Ambulatory Visit: Payer: BC Managed Care – PPO

## 2017-12-17 DIAGNOSIS — R1013 Epigastric pain: Secondary | ICD-10-CM

## 2017-12-29 ENCOUNTER — Other Ambulatory Visit (HOSPITAL_COMMUNITY): Payer: Self-pay | Admitting: Family Medicine

## 2017-12-29 DIAGNOSIS — R1013 Epigastric pain: Secondary | ICD-10-CM

## 2018-01-01 ENCOUNTER — Encounter (HOSPITAL_COMMUNITY)
Admission: RE | Admit: 2018-01-01 | Discharge: 2018-01-01 | Disposition: A | Discharge status: Home / Self Care | Payer: BC Managed Care – PPO | Source: Ambulatory Visit | Attending: Family Medicine | Admitting: Family Medicine

## 2018-01-01 DIAGNOSIS — R1013 Epigastric pain: Secondary | ICD-10-CM | POA: Diagnosis not present

## 2018-01-01 MED ORDER — TECHNETIUM TC 99M MEBROFENIN IV KIT
5.0000 | PACK | Freq: Once | INTRAVENOUS | Status: AC | PRN
  Administered 2018-01-01: 5 via INTRAVENOUS

## 2018-01-31 ENCOUNTER — Encounter: Payer: Self-pay | Admitting: Surgery

## 2018-01-31 DIAGNOSIS — K828 Other specified diseases of gallbladder: Secondary | ICD-10-CM | POA: Diagnosis present

## 2018-01-31 NOTE — H&P (Signed)
General Surgery [facility]  Linda Mata DOB: 06/08/1961 Single / Language: English / Race: White Female   History of Present Illness  The patient is a 57 year old female who presents for evaluation of gall stones.  CHIEF COMPLAINT: biliary dyskinesia, abdominal pain  Patient is referred by Dr. Ulanda Edison for surgical evaluation and management of biliary dyskinesia and abdominal pain. Patient gives a history of approximately 8 years of right upper quadrant abdominal pain. This is intermittent. It is sometimes more severe than others. She describes nausea following meals. She has had rare episodes of emesis. Laboratory studies have shown a minimal elevation of her transaminases on occasion. She denies any history of jaundice. She denies any history of hepatitis or pancreatitis. Previous abdominal surgery includes laparoscopy and removal of fibroid tumor. Patient states that there is no history of gallbladder disease in her family. Patient underwent abdominal ultrasound on December 17, 2017 which showed no evidence of gallstones or biliary ductal dilatation. There was mild hepatic steatosis. Patient underwent nuclear medicine hepatobiliary scan on January 01, 2018. This showed no evidence of obstruction. However there was a diminished gallbladder ejection fraction of 27% which is felt to be due to biliary dyskinesia. Patient now presents to discuss cholecystectomy for management of biliary dyskinesia and abdominal pain.   Past Surgical History  Breast Biopsy  Left. Breast Mass; Local Excision  Left. Colon Polyp Removal - Colonoscopy  Tonsillectomy   Diagnostic Studies History Colonoscopy  5-10 years ago Mammogram  1-3 years ago Pap Smear  1-5 years ago  Allergies Dexilant *ULCER DRUGS/ANTISPASMODICS/ANTICHOLINERGICS*   Medication History Milk Thistle (175MG  Capsule, Oral) Active. Vitamin D3 (1000UNIT Tablet Chewable, Oral)  Active. Multivitamin & Mineral (Oral) Active.  Social History Alcohol use  Occasional alcohol use. Caffeine use  Coffee, Tea. No drug use  Tobacco use  Former smoker.  Family History Cancer  Family Members In General. Cerebrovascular Accident  Father. Depression  Mother. Diabetes Mellitus  Brother, Mother. Heart Disease  Father. Hypertension  Brother, Father. Respiratory Condition  Family Members In General, Father. Thyroid problems  Mother.  Pregnancy / Birth History Age at menarche  23 years. Age of menopause  <45 Contraceptive History  Intrauterine device, Oral contraceptives. Gravida  1 Maternal age  16-25 Para  0  Other Problems Asthma  Back Pain  Emphysema Of Lung  Gastroesophageal Reflux Disease  Hemorrhoids  Hypercholesterolemia  Other disease, cancer, significant illness   Review of Systems General Present- Appetite Loss, Fatigue and Weight Gain. Not Present- Chills, Fever, Night Sweats and Weight Loss. Skin Present- Change in Wart/Mole. Not Present- Dryness, Hives, Jaundice, New Lesions, Non-Healing Wounds, Rash and Ulcer. HEENT Present- Wears glasses/contact lenses. Not Present- Earache, Hearing Loss, Hoarseness, Nose Bleed, Oral Ulcers, Ringing in the Ears, Seasonal Allergies, Sinus Pain, Sore Throat, Visual Disturbances and Yellow Eyes. Respiratory Present- Difficulty Breathing. Not Present- Bloody sputum, Chronic Cough, Snoring and Wheezing. Breast Not Present- Breast Mass, Breast Pain, Nipple Discharge and Skin Changes. Cardiovascular Present- Difficulty Breathing Lying Down, Leg Cramps and Swelling of Extremities. Not Present- Chest Pain, Palpitations, Rapid Heart Rate and Shortness of Breath. Gastrointestinal Present- Abdominal Pain, Bloating, Excessive gas, Gets full quickly at meals, Hemorrhoids, Indigestion, Nausea and Vomiting. Not Present- Bloody Stool, Change in Bowel Habits, Chronic diarrhea, Constipation, Difficulty  Swallowing and Rectal Pain. Female Genitourinary Not Present- Frequency, Nocturia, Painful Urination, Pelvic Pain and Urgency. Musculoskeletal Present- Back Pain, Joint Stiffness, Muscle Pain and Swelling of Extremities. Not Present- Joint Pain  and Muscle Weakness. Neurological Not Present- Decreased Memory, Fainting, Headaches, Numbness, Seizures, Tingling, Tremor, Trouble walking and Weakness. Psychiatric Not Present- Anxiety, Bipolar, Change in Sleep Pattern, Depression, Fearful and Frequent crying. Endocrine Not Present- Cold Intolerance, Excessive Hunger, Hair Changes, Heat Intolerance, Hot flashes and New Diabetes. Hematology Present- Easy Bruising. Not Present- Blood Thinners, Excessive bleeding, Gland problems, HIV and Persistent Infections.  Vitals Weight: 244.25 lb Height: 66in Body Surface Area: 2.18 m Body Mass Index: 39.42 kg/m  Temp.: 96.94F(Oral)  Pulse: 86 (Regular)  Resp.: 18 (Unlabored)  P.OX: 98% (Room air) BP: 128/84 (Sitting, Left Arm, Standard)   Physical Exam  See vital signs recorded above  GENERAL APPEARANCE Development: normal Nutritional status: normal Gross deformities: none  SKIN Rash, lesions, ulcers: none Induration, erythema: none Nodules: none palpable  EYES Conjunctiva and lids: normal Pupils: equal and reactive Iris: normal bilaterally  EARS, NOSE, MOUTH, THROAT External ears: no lesion or deformity External nose: no lesion or deformity Hearing: grossly normal Lips: no lesion or deformity Dentition: normal for age Oral mucosa: moist  NECK Symmetric: yes Trachea: midline Thyroid: no palpable nodules in the thyroid bed  CHEST Respiratory effort: normal Retraction or accessory muscle use: no Breath sounds: normal bilaterally Rales, rhonchi, wheeze: none  CARDIOVASCULAR Auscultation: regular rhythm, normal rate Murmurs: none Pulses: carotid and radial pulse 2+ palpable Lower extremity edema: none Lower  extremity varicosities: none  ABDOMEN Distension: none Masses: none palpable Tenderness: none Hepatosplenomegaly: not present Hernia: not present Well-healed surgical incision of the umbilicus consistent with previous laparoscopy  MUSCULOSKELETAL Station and gait: normal Digits and nails: no clubbing or cyanosis Muscle strength: grossly normal all extremities Range of motion: grossly normal all extremities Deformity: none  LYMPHATIC Cervical: none palpable Supraclavicular: none palpable  PSYCHIATRIC Oriented to person, place, and time: yes Mood and affect: normal for situation Judgment and insight: appropriate for situation    Assessment & Plan  BILIARY DYSKINESIA (K82.8)   Pt Education - Pamphlet Given - Laparoscopic Gallbladder Surgery: discussed with patient and provided information.  Patient is referred by her primary care physician for surgical evaluation for cholecystectomy for treatment of biliary dyskinesia and right upper quadrant abdominal pain. Patient is provided with written literature on laparoscopic cholecystectomy to review at home.  Patient has diagnostic studies indicating biliary dyskinesia. We have reviewed her clinical history. I have suggested proceeding with laparoscopic cholecystectomy with intraoperative cholangiography for management of biliary dyskinesia. We discussed the fact that the success rate for relief of symptoms is much higher when people have gallstones. In episodes of biliary dyskinesia, the success of this procedure with relief of symptoms is approximately 70%. We discussed laparoscopic cholecystectomy at length. I provided her with written literature to review at home. I described for her the mechanism of biliary dyskinesia and demonstrated this with the diagrams in the materials I provided to her. Patient understands and wishes to proceed with surgery. We will make arrangements for cholecystectomy with intraoperative  cholangiography at a time convenient for the patient in the near future. She should plan on an overnight hospital stay. We discussed her postoperative recovery and return to normal activities. She understands.  The risks and benefits of the procedure have been discussed at length with the patient. The patient understands the proposed procedure, potential alternative treatments, and the course of recovery to be expected. All of the patient's questions have been answered at this time. The patient wishes to proceed with surgery.  Armandina Gemma, [facility] Office: [phone]

## 2018-02-01 ENCOUNTER — Ambulatory Visit: Payer: Self-pay | Admitting: Surgery

## 2018-02-03 NOTE — Patient Instructions (Signed)
Breella Greenhouse  02/03/2018   Your procedure is scheduled on: 02-05-18   Report to [facility] Main  Entrance    Report to Admitting at 7:30 AM    Call this number if you have problems the morning of surgery [phone]   Remember: Do not eat food or drink liquids :After Midnight.     Take these medicines the morning of surgery with A SIP OF WATER: None                                You may not have any metal on your body including hair pins and              piercings  Do not wear jewelry, make-up, lotions, powders or perfumes, deodorant             Do not wear nail polish.  Do not shave  48 hours prior to surgery.               Do not bring valuables to the hospital. [place].  Contacts, dentures or bridgework may not be worn into surgery.  Leave suitcase in the car. After surgery it may be brought to your room.      Special Instructions: N/A              Please read over the following fact sheets you were given: _____________________________________________________________________             [facility] - Preparing for Surgery Before surgery, you can play an important role.  Because skin is not sterile, your skin needs to be as free of germs as possible.  You can reduce the number of germs on your skin by washing with CHG (chlorahexidine gluconate) soap before surgery.  CHG is an antiseptic cleaner which kills germs and bonds with the skin to continue killing germs even after washing. Please DO NOT use if you have an allergy to CHG or antibacterial soaps.  If your skin becomes reddened/irritated stop using the CHG and inform your nurse when you arrive at Short Stay. Do not shave (including legs and underarms) for at least 48 hours prior to the first CHG shower.  You may shave your face/neck. Please follow these instructions carefully:  1.  Shower with CHG Soap the night before surgery and the  morning of  Surgery.  2.  If you choose to wash your hair, wash your hair first as usual with your  normal  shampoo.  3.  After you shampoo, rinse your hair and body thoroughly to remove the  shampoo.                           4.  Use CHG as you would any other liquid soap.  You can apply chg directly  to the skin and wash                       Gently with a scrungie or clean washcloth.  5.  Apply the CHG Soap to your body ONLY FROM THE NECK DOWN.   Do not use on face/ open  Wound or open sores. Avoid contact with eyes, ears mouth and genitals (private parts).                       Wash face,  Genitals (private parts) with your normal soap.             6.  Wash thoroughly, paying special attention to the area where your surgery  will be performed.  7.  Thoroughly rinse your body with warm water from the neck down.  8.  DO NOT shower/wash with your normal soap after using and rinsing off  the CHG Soap.                9.  Pat yourself dry with a clean towel.            10.  Wear clean pajamas.            11.  Place clean sheets on your bed the night of your first shower and do not  sleep with pets. Day of Surgery : Do not apply any lotions/deodorants the morning of surgery.  Please wear clean clothes to the hospital/surgery center.  FAILURE TO FOLLOW THESE INSTRUCTIONS MAY RESULT IN THE CANCELLATION OF YOUR SURGERY PATIENT SIGNATURE_________________________________  NURSE SIGNATURE__________________________________  ________________________________________________________________________

## 2018-02-04 ENCOUNTER — Other Ambulatory Visit: Payer: Self-pay

## 2018-02-04 ENCOUNTER — Encounter (HOSPITAL_COMMUNITY)
Admission: RE | Admit: 2018-02-04 | Discharge: 2018-02-04 | Disposition: A | Discharge status: Home / Self Care | Payer: BC Managed Care – PPO | Source: Ambulatory Visit | Attending: Surgery | Admitting: Surgery

## 2018-02-04 ENCOUNTER — Encounter (HOSPITAL_COMMUNITY): Payer: Self-pay

## 2018-02-04 DIAGNOSIS — K8080 Other cholelithiasis without obstruction: Secondary | ICD-10-CM | POA: Insufficient documentation

## 2018-02-04 DIAGNOSIS — Z01812 Encounter for preprocedural laboratory examination: Secondary | ICD-10-CM | POA: Diagnosis not present

## 2018-02-04 LAB — CBC
HEMATOCRIT: 42.3 % (ref 36.0–46.0)
HEMOGLOBIN: 13.7 g/dL (ref 12.0–15.0)
MCH: 28 pg (ref 26.0–34.0)
MCHC: 32.4 g/dL (ref 30.0–36.0)
MCV: 86.5 fL (ref 78.0–100.0)
Platelets: 219 10*3/uL (ref 150–400)
RBC: 4.89 MIL/uL (ref 3.87–5.11)
RDW: 13.6 % (ref 11.5–15.5)
WBC: 7.4 10*3/uL (ref 4.0–10.5)

## 2018-02-04 LAB — BASIC METABOLIC PANEL
ANION GAP: 8 (ref 5–15)
BUN: 14 mg/dL (ref 6–20)
CHLORIDE: 109 mmol/L (ref 98–111)
CO2: 27 mmol/L (ref 22–32)
Calcium: 9 mg/dL (ref 8.9–10.3)
Creatinine, Ser: 0.87 mg/dL (ref 0.44–1.00)
GFR calc Af Amer: >60 mL/min (ref >60)
GFR calc non Af Amer: >60 mL/min (ref >60)
GLUCOSE: 105 mg/dL — AB (ref 70–99)
POTASSIUM: 4.6 mmol/L (ref 3.5–5.1)
Sodium: 144 mmol/L (ref 135–145)

## 2018-02-05 ENCOUNTER — Ambulatory Visit (HOSPITAL_COMMUNITY): Payer: BC Managed Care – PPO | Admitting: Anesthesiology

## 2018-02-05 ENCOUNTER — Observation Stay (HOSPITAL_COMMUNITY)
Admission: RE | Admit: 2018-02-05 | Discharge: 2018-02-06 | Disposition: A | Discharge status: Home / Self Care | Payer: BC Managed Care – PPO | Source: Ambulatory Visit | Attending: Surgery | Admitting: Surgery

## 2018-02-05 ENCOUNTER — Encounter (HOSPITAL_COMMUNITY)
Admission: RE | Disposition: A | Discharge status: Home / Self Care | Payer: Self-pay | Source: Ambulatory Visit | Attending: Surgery

## 2018-02-05 ENCOUNTER — Other Ambulatory Visit: Payer: Self-pay

## 2018-02-05 ENCOUNTER — Ambulatory Visit (HOSPITAL_COMMUNITY): Payer: BC Managed Care – PPO

## 2018-02-05 ENCOUNTER — Encounter (HOSPITAL_COMMUNITY): Payer: Self-pay | Admitting: *Deleted

## 2018-02-05 DIAGNOSIS — K219 Gastro-esophageal reflux disease without esophagitis: Secondary | ICD-10-CM | POA: Insufficient documentation

## 2018-02-05 DIAGNOSIS — Z7982 Long term (current) use of aspirin: Secondary | ICD-10-CM | POA: Insufficient documentation

## 2018-02-05 DIAGNOSIS — Z79899 Other long term (current) drug therapy: Secondary | ICD-10-CM | POA: Insufficient documentation

## 2018-02-05 DIAGNOSIS — E7800 Pure hypercholesterolemia, unspecified: Secondary | ICD-10-CM | POA: Diagnosis not present

## 2018-02-05 DIAGNOSIS — K811 Chronic cholecystitis: Principal | ICD-10-CM | POA: Insufficient documentation

## 2018-02-05 DIAGNOSIS — J45909 Unspecified asthma, uncomplicated: Secondary | ICD-10-CM | POA: Insufficient documentation

## 2018-02-05 DIAGNOSIS — Z87891 Personal history of nicotine dependence: Secondary | ICD-10-CM | POA: Insufficient documentation

## 2018-02-05 DIAGNOSIS — K828 Other specified diseases of gallbladder: Secondary | ICD-10-CM | POA: Insufficient documentation

## 2018-02-05 HISTORY — PX: CHOLECYSTECTOMY: SHX55

## 2018-02-05 SURGERY — LAPAROSCOPIC CHOLECYSTECTOMY WITH INTRAOPERATIVE CHOLANGIOGRAM
Anesthesia: General | Site: Abdomen

## 2018-02-05 MED ORDER — KETOROLAC TROMETHAMINE 30 MG/ML IJ SOLN
INTRAMUSCULAR | Status: DC | PRN
  Administered 2018-02-05: 30 mg via INTRAVENOUS

## 2018-02-05 MED ORDER — CHLORHEXIDINE GLUCONATE CLOTH 2 % EX PADS: 6.0000 | MEDICATED_PAD | Freq: Once | CUTANEOUS | Status: DC

## 2018-02-05 MED ORDER — ONDANSETRON 4 MG PO TBDP: 4.0000 mg | ORAL_TABLET | Freq: Four times a day (QID) | ORAL | Status: DC | PRN

## 2018-02-05 MED ORDER — ROCURONIUM BROMIDE 10 MG/ML (PF) SYRINGE
PREFILLED_SYRINGE | INTRAVENOUS | Status: DC | PRN
  Administered 2018-02-05: 40 mg via INTRAVENOUS

## 2018-02-05 MED ORDER — MIDAZOLAM HCL 2 MG/2ML IJ SOLN
INTRAMUSCULAR | Status: AC
  Filled 2018-02-05: qty 2

## 2018-02-05 MED ORDER — FENTANYL CITRATE (PF) 100 MCG/2ML IJ SOLN
INTRAMUSCULAR | Status: DC | PRN
  Administered 2018-02-05 (×2): 50 ug via INTRAVENOUS

## 2018-02-05 MED ORDER — FAMOTIDINE 20 MG PO TABS: 20.0000 mg | ORAL_TABLET | Freq: Once | ORAL | Status: DC

## 2018-02-05 MED ORDER — ONDANSETRON HCL 4 MG/2ML IJ SOLN
INTRAMUSCULAR | Status: DC | PRN
  Administered 2018-02-05: 4 mg via INTRAVENOUS

## 2018-02-05 MED ORDER — PROMETHAZINE HCL 25 MG/ML IJ SOLN: 6.2500 mg | INTRAMUSCULAR | Status: DC | PRN

## 2018-02-05 MED ORDER — DEXAMETHASONE SODIUM PHOSPHATE 10 MG/ML IJ SOLN
INTRAMUSCULAR | Status: DC | PRN
  Administered 2018-02-05: 10 mg via INTRAVENOUS

## 2018-02-05 MED ORDER — FENTANYL CITRATE (PF) 100 MCG/2ML IJ SOLN
INTRAMUSCULAR | Status: AC
  Filled 2018-02-05: qty 2

## 2018-02-05 MED ORDER — CELECOXIB 200 MG PO CAPS: 200.0000 mg | ORAL_CAPSULE | Freq: Once | ORAL | Status: DC | PRN

## 2018-02-05 MED ORDER — OXYCODONE HCL 5 MG PO TABS: 5.0000 mg | ORAL_TABLET | Freq: Once | ORAL | Status: DC | PRN

## 2018-02-05 MED ORDER — FENTANYL CITRATE (PF) 250 MCG/5ML IJ SOLN
INTRAMUSCULAR | Status: DC | PRN
  Administered 2018-02-05: 100 ug via INTRAVENOUS
  Administered 2018-02-05 (×3): 50 ug via INTRAVENOUS

## 2018-02-05 MED ORDER — HYDROMORPHONE HCL 1 MG/ML IJ SOLN
INTRAMUSCULAR | Status: AC
  Filled 2018-02-05: qty 1

## 2018-02-05 MED ORDER — OXYCODONE HCL 5 MG/5ML PO SOLN
5.0000 mg | Freq: Once | ORAL | Status: DC | PRN
  Filled 2018-02-05: qty 5

## 2018-02-05 MED ORDER — ACETAMINOPHEN 325 MG PO TABS
650.0000 mg | ORAL_TABLET | Freq: Four times a day (QID) | ORAL | Status: DC | PRN
  Administered 2018-02-05: 650 mg via ORAL
  Filled 2018-02-05: qty 2

## 2018-02-05 MED ORDER — CEFAZOLIN SODIUM-DEXTROSE 2-4 GM/100ML-% IV SOLN
2.0000 g | INTRAVENOUS | Status: AC
  Administered 2018-02-05: 2 g via INTRAVENOUS
  Filled 2018-02-05: qty 100

## 2018-02-05 MED ORDER — TRAMADOL HCL 50 MG PO TABS: 50.0000 mg | ORAL_TABLET | Freq: Four times a day (QID) | ORAL | Status: DC | PRN

## 2018-02-05 MED ORDER — LACTATED RINGERS IV SOLN
INTRAVENOUS | Status: DC
  Administered 2018-02-05 (×2): via INTRAVENOUS

## 2018-02-05 MED ORDER — BUPIVACAINE-EPINEPHRINE (PF) 0.25% -1:200000 IJ SOLN
INTRAMUSCULAR | Status: AC
  Filled 2018-02-05: qty 30

## 2018-02-05 MED ORDER — ONDANSETRON HCL 4 MG/2ML IJ SOLN: 4.0000 mg | Freq: Four times a day (QID) | INTRAMUSCULAR | Status: DC | PRN

## 2018-02-05 MED ORDER — IOPAMIDOL (ISOVUE-300) INJECTION 61%
INTRAVENOUS | Status: AC
  Filled 2018-02-05: qty 50

## 2018-02-05 MED ORDER — LIDOCAINE 2% (20 MG/ML) 5 ML SYRINGE
INTRAMUSCULAR | Status: DC | PRN
  Administered 2018-02-05: 80 mg via INTRAVENOUS

## 2018-02-05 MED ORDER — SUGAMMADEX SODIUM 200 MG/2ML IV SOLN
INTRAVENOUS | Status: DC | PRN
  Administered 2018-02-05: 250 mg via INTRAVENOUS

## 2018-02-05 MED ORDER — ACETAMINOPHEN 10 MG/ML IV SOLN
INTRAVENOUS | Status: AC
  Filled 2018-02-05: qty 100

## 2018-02-05 MED ORDER — PHENYLEPHRINE 40 MCG/ML (10ML) SYRINGE FOR IV PUSH (FOR BLOOD PRESSURE SUPPORT)
PREFILLED_SYRINGE | INTRAVENOUS | Status: DC | PRN
  Administered 2018-02-05 (×3): 80 ug via INTRAVENOUS
  Administered 2018-02-05: 120 ug via INTRAVENOUS

## 2018-02-05 MED ORDER — PROPOFOL 10 MG/ML IV BOLUS
INTRAVENOUS | Status: DC | PRN
  Administered 2018-02-05: 50 mg via INTRAVENOUS
  Administered 2018-02-05: 100 mg via INTRAVENOUS

## 2018-02-05 MED ORDER — SCOPOLAMINE 1 MG/3DAYS TD PT72: 1.0000 | MEDICATED_PATCH | Freq: Once | TRANSDERMAL | Status: DC

## 2018-02-05 MED ORDER — ACETAMINOPHEN 650 MG RE SUPP: 650.0000 mg | Freq: Four times a day (QID) | RECTAL | Status: DC | PRN

## 2018-02-05 MED ORDER — BUPIVACAINE-EPINEPHRINE (PF) 0.25% -1:200000 IJ SOLN
INTRAMUSCULAR | Status: DC | PRN
  Administered 2018-02-05: 20 mL

## 2018-02-05 MED ORDER — HYDROMORPHONE HCL 1 MG/ML IJ SOLN: 1.0000 mg | INTRAMUSCULAR | Status: DC | PRN

## 2018-02-05 MED ORDER — KCL IN DEXTROSE-NACL 20-5-0.45 MEQ/L-%-% IV SOLN
INTRAVENOUS | Status: DC
  Administered 2018-02-05: 14:00:00 via INTRAVENOUS
  Filled 2018-02-05: qty 1000

## 2018-02-05 MED ORDER — HYDROMORPHONE HCL 1 MG/ML IJ SOLN: 0.2500 mg | INTRAMUSCULAR | Status: DC | PRN

## 2018-02-05 MED ORDER — FENTANYL CITRATE (PF) 250 MCG/5ML IJ SOLN
INTRAMUSCULAR | Status: AC
  Filled 2018-02-05: qty 5

## 2018-02-05 MED ORDER — ACETAMINOPHEN 500 MG PO TABS: 1000.0000 mg | ORAL_TABLET | Freq: Once | ORAL | Status: DC

## 2018-02-05 MED ORDER — PROPOFOL 10 MG/ML IV BOLUS
INTRAVENOUS | Status: AC
  Filled 2018-02-05: qty 20

## 2018-02-05 MED ORDER — LACTATED RINGERS IR SOLN
Status: DC | PRN
  Administered 2018-02-05: 1000 mL

## 2018-02-05 MED ORDER — ACETAMINOPHEN 10 MG/ML IV SOLN
INTRAVENOUS | Status: DC | PRN
  Administered 2018-02-05: 1000 mg via INTRAVENOUS

## 2018-02-05 MED ORDER — HYDROCODONE-ACETAMINOPHEN 5-325 MG PO TABS: 1.0000 | ORAL_TABLET | ORAL | Status: DC | PRN

## 2018-02-05 MED ORDER — ACETAMINOPHEN 160 MG/5ML PO SOLN: 960.0000 mg | Freq: Once | ORAL | Status: DC

## 2018-02-05 MED ORDER — MIDAZOLAM HCL 2 MG/2ML IJ SOLN
INTRAMUSCULAR | Status: DC | PRN
  Administered 2018-02-05: 2 mg via INTRAVENOUS
  Administered 2018-02-05 (×2): 1 mg via INTRAVENOUS

## 2018-02-05 MED ORDER — 0.9 % SODIUM CHLORIDE (POUR BTL) OPTIME
TOPICAL | Status: DC | PRN
  Administered 2018-02-05: 1000 mL

## 2018-02-05 MED ORDER — IOPAMIDOL (ISOVUE-300) INJECTION 61%
INTRAVENOUS | Status: DC | PRN
  Administered 2018-02-05: 2 mL

## 2018-02-05 SURGICAL SUPPLY — 30 items
APPLIER CLIP ROT 10 11.4 M/L (STAPLE) ×2
CABLE HIGH FREQUENCY MONO STRZ (ELECTRODE) ×2 IMPLANT
CHLORAPREP W/TINT 26ML (MISCELLANEOUS) ×4 IMPLANT
CLIP APPLIE ROT 10 11.4 M/L (STAPLE) ×1 IMPLANT
COVER MAYO STAND STRL (DRAPES) ×2 IMPLANT
COVER SURGICAL LIGHT HANDLE (MISCELLANEOUS) ×2 IMPLANT
DECANTER SPIKE VIAL GLASS SM (MISCELLANEOUS) ×2 IMPLANT
DRAPE C-ARM 42X120 X-RAY (DRAPES) ×2 IMPLANT
ELECT REM PT RETURN 15FT ADLT (MISCELLANEOUS) ×2 IMPLANT
GAUZE SPONGE 2X2 8PLY STRL LF (GAUZE/BANDAGES/DRESSINGS) ×1 IMPLANT
GLOVE SURG ORTHO 8.0 STRL STRW (GLOVE) ×2 IMPLANT
GOWN STRL REUS W/TWL XL LVL3 (GOWN DISPOSABLE) ×4 IMPLANT
HEMOSTAT SURGICEL 4X8 (HEMOSTASIS) IMPLANT
KIT BASIN OR (CUSTOM PROCEDURE TRAY) ×2 IMPLANT
POUCH SPECIMEN RETRIEVAL 10MM (ENDOMECHANICALS) ×2 IMPLANT
SCISSORS LAP 5X35 DISP (ENDOMECHANICALS) ×2 IMPLANT
SET CHOLANGIOGRAPH MIX (MISCELLANEOUS) ×2 IMPLANT
SET IRRIG TUBING LAPAROSCOPIC (IRRIGATION / IRRIGATOR) ×2 IMPLANT
SLEEVE XCEL OPT CAN 5 100 (ENDOMECHANICALS) ×2 IMPLANT
SPONGE GAUZE 2X2 STER 10/PKG (GAUZE/BANDAGES/DRESSINGS) ×1
STRIP CLOSURE SKIN 1/2X4 (GAUZE/BANDAGES/DRESSINGS) ×2 IMPLANT
SUT MNCRL AB 4-0 PS2 18 (SUTURE) ×2 IMPLANT
TAPE CLOTH SURG 4X10 WHT LF (GAUZE/BANDAGES/DRESSINGS) ×2 IMPLANT
TOWEL OR 17X26 10 PK STRL BLUE (TOWEL DISPOSABLE) ×2 IMPLANT
TOWEL OR NON WOVEN STRL DISP B (DISPOSABLE) ×2 IMPLANT
TRAY LAPAROSCOPIC (CUSTOM PROCEDURE TRAY) ×2 IMPLANT
TROCAR BLADELESS OPT 5 100 (ENDOMECHANICALS) ×2 IMPLANT
TROCAR XCEL BLUNT TIP 100MML (ENDOMECHANICALS) ×2 IMPLANT
TROCAR XCEL NON-BLD 11X100MML (ENDOMECHANICALS) ×2 IMPLANT
TUBING INSUF HEATED (TUBING) IMPLANT

## 2018-02-05 NOTE — Anesthesia Postprocedure Evaluation (Signed)
Anesthesia Post Note  Patient: Linda Mata  Procedure(s) Performed: LAPAROSCOPIC CHOLECYSTECTOMY WITH INTRAOPERATIVE CHOLANGIOGRAM (N/A Abdomen)     Patient location during evaluation: PACU Anesthesia Type: General Level of consciousness: awake and alert Pain management: pain level controlled Vital Signs Assessment: post-procedure vital signs reviewed and stable Respiratory status: spontaneous breathing, nonlabored ventilation, respiratory function stable and patient connected to nasal cannula oxygen Cardiovascular status: blood pressure returned to baseline and stable Postop Assessment: no apparent nausea or vomiting Anesthetic complications: no    Last Vitals:  Vitals:   02/05/18 1240 02/05/18 1347  BP: 115/77 115/69  Pulse: 65 65  Resp: 14 18  Temp: 37 C 37 C  SpO2: 100% 95%    Last Pain:  Vitals:   02/05/18 1347  TempSrc: Oral  PainSc:                  [name] [name]

## 2018-02-05 NOTE — Op Note (Signed)
Procedure Note  Pre-operative Diagnosis:  Biliary dyskinesia, abdominal pain  Post-operative Diagnosis:  same  Surgeon:  Armandina Gemma, MD  Assistant:  none   Procedure:  Laparoscopic cholecystectomy with intra-operative cholangiography  Anesthesia:  General  Estimated Blood Loss:  minimal  Drains: none         Specimen: Gallbladder to pathology  Indications:  Patient is referred by Dr. Ulanda Edison for surgical evaluation and management of biliary dyskinesia and abdominal pain. Patient gives a history of approximately 8 years of right upper quadrant abdominal pain. This is intermittent. It is sometimes more severe than others. She describes nausea following meals. She has had rare episodes of emesis. Laboratory studies have shown a minimal elevation of her transaminases on occasion. She denies any history of jaundice. She denies any history of hepatitis or pancreatitis. Previous abdominal surgery includes laparoscopy and removal of fibroid tumor. Patient states that there is no history of gallbladder disease in her family. Patient underwent abdominal ultrasound on December 17, 2017 which showed no evidence of gallstones or biliary ductal dilatation. There was mild hepatic steatosis. Patient underwent nuclear medicine hepatobiliary scan on January 01, 2018. This showed no evidence of obstruction. However there was a diminished gallbladder ejection fraction of 27% which is felt to be due to biliary dyskinesia. Patient now presents to discuss cholecystectomy for management of biliary dyskinesia and abdominal pain.  Procedure Details:  The patient was seen in the pre-op holding area. The risks, benefits, complications, treatment options, and expected outcomes were previously discussed with the patient. The patient agreed with the proposed plan and has signed the informed consent form.  The patient was brought to the Operating Room, identified as [place] and the procedure verified  as laparoscopic cholecystectomy with intraoperative cholangiography. A "time out" was completed and the above information confirmed.  The patient was placed in the supine position. Following induction of general anesthesia, the abdomen was prepped and draped in the usual aseptic fashion.  An incision was made in the skin near the umbilicus. The midline fascia was incised and the peritoneal cavity was entered and a Hasson canula was introduced under direct vision.  The Hasson canula was secured with a 0-Vicryl pursestring suture. Pneumoperitoneum was established with carbon dioxide. Additional trocars were introduced under direct vision along the right costal margin in the midline, mid-clavicular line, and anterior axillary line.   The gallbladder was identified and the fundus grasped and retracted cephalad. Adhesions were taken down bluntly and the electrocautery was utilized as needed, taking care not to injure any adjacent structures. The infundibulum was grasped and retracted laterally, exposing the peritoneum overlying the triangle of Calot. The peritoneum was incised and structures exposed with blunt dissection. The cystic duct was clearly identified, bluntly dissected circumferentially, and clipped at the neck of the gallbladder.  An incision was made in the cystic duct and the cholangiogram catheter introduced. The catheter was secured using an ligaclip.  Real-time cholangiography was performed using C-arm fluoroscopy.  There was rapid filling of a normal caliber common bile duct.  There was reflux of contrast into the left and right hepatic ductal systems.  There was free flow distally into the duodenum without filling defect or obstruction.  The catheter was removed from the peritoneal cavity.  The cystic duct was then ligated with surgical clips and divided. The cystic artery was identified, dissected circumferentially, ligated with ligaclips, and divided.  The gallbladder was dissected away  from the liver bed using the electrocautery for  hemostasis. The gallbladder was completely removed from the liver and placed into an endocatch bag. The gallbladder was removed in the endocatch bag through the umbilical port site and submitted to pathology for review.  The right upper quadrant was irrigated and the gallbladder bed was inspected. Hemostasis was achieved with the electrocautery.  Pneumoperitoneum was released after viewing removal of the trocars with good hemostasis noted. The umbilical wound was irrigated and the fascia was then closed with the pursestring suture.  Local anesthetic was infiltrated at all port sites. The skin incisions were closed with 4-0 Monocril subcuticular sutures and steri-strips and dressings were applied.  Instrument, sponge, and needle counts were correct at the conclusion of the case.  The patient was awakened from anesthesia and brought to the recovery room in stable condition.  The patient tolerated the procedure well.   Armandina Gemma, MD [facility] Office: [phone]

## 2018-02-05 NOTE — Interval H&P Note (Signed)
History and Physical Interval Note:  02/05/2018 9:09 AM  Linda Mata  has presented today for surgery, with the diagnosis of abdominal pain and bilinary dyskensia.  The various methods of treatment have been discussed with the patient and family. After consideration of risks, benefits and other options for treatment, the patient has consented to    Procedure(s): LAPAROSCOPIC CHOLECYSTECTOMY WITH INTRAOPERATIVE CHOLANGIOGRAM (N/A) as a surgical intervention.   The patient's history has been reviewed, patient examined, no change in status, stable for surgery.  I have reviewed the patient's chart and labs.  Questions were answered to the patient's satisfaction.    Armandina Gemma, [facility] Office: [place]

## 2018-02-05 NOTE — Anesthesia Procedure Notes (Signed)
Date/Time: 02/05/2018 10:36 AM Performed by: Cynda Familia, CRNA Oxygen Delivery Method: Simple face mask Placement Confirmation: positive ETCO2 and breath sounds checked- equal and bilateral Dental Injury: Teeth and Oropharynx as per pre-operative assessment

## 2018-02-05 NOTE — Anesthesia Preprocedure Evaluation (Addendum)
Anesthesia Evaluation  Patient identified by MRN, date of birth, ID band Patient awake    Reviewed: Allergy & Precautions, NPO status , Patient's Chart, lab work & pertinent test results  Airway Mallampati: I  TM Distance: >3 FB Neck ROM: Full    Dental no notable dental hx. (+) Dental Advisory Given   Pulmonary asthma , former smoker,    Pulmonary exam normal breath sounds clear to auscultation       Cardiovascular negative cardio ROS Normal cardiovascular exam Rhythm:Regular Rate:Normal     Neuro/Psych  Headaches, PSYCHIATRIC DISORDERS Anxiety Depression    GI/Hepatic Neg liver ROS, GERD  ,  Endo/Other  Morbid obesity  Renal/GU negative Renal ROS     Musculoskeletal negative musculoskeletal ROS (+)   Abdominal (+) + obese,   Peds  Hematology HLD   Anesthesia Other Findings abdominal pain bilinary dyskensia  Reproductive/Obstetrics                           Anesthesia Physical Anesthesia Plan  ASA: III  Anesthesia Plan: General   Post-op Pain Management:    Induction: Intravenous  PONV Risk Score and Plan: 4 or greater and Midazolam, Dexamethasone, Ondansetron and Treatment may vary due to age or medical condition  Airway Management Planned: Oral ETT  Additional Equipment:   Intra-op Plan:   Post-operative Plan: Extubation in OR  Informed Consent: I have reviewed the patients History and Physical, chart, labs and discussed the procedure including the risks, benefits and alternatives for the proposed anesthesia with the patient or authorized representative who has indicated his/her understanding and acceptance.   Dental advisory given  Plan Discussed with: CRNA  Anesthesia Plan Comments:        Anesthesia Quick Evaluation

## 2018-02-05 NOTE — Transfer of Care (Signed)
Immediate Anesthesia Transfer of Care Note  Patient: Linda Mata  Procedure(s) Performed: LAPAROSCOPIC CHOLECYSTECTOMY WITH INTRAOPERATIVE CHOLANGIOGRAM (N/A Abdomen)  Patient Location: PACU  Anesthesia Type:General  Level of Consciousness: awake and alert   Airway & Oxygen Therapy: Patient Spontanous Breathing and Patient connected to face mask oxygen  Post-op Assessment: Report given to RN and Post -op Vital signs reviewed and stable  Post vital signs: Reviewed and stable  Last Vitals:  Vitals Value Taken Time  BP 118/81 02/05/2018 10:46 AM  Temp    Pulse 74 02/05/2018 10:51 AM  Resp 17 02/05/2018 10:51 AM  SpO2 100 % 02/05/2018 10:51 AM  Vitals shown include unvalidated device data.  Last Pain:  Vitals:   02/05/18 0809  TempSrc:   PainSc: 0-No pain      Patients Stated Pain Goal: 3 (11/88/67 7373)  Complications: No apparent anesthesia complications

## 2018-02-05 NOTE — Anesthesia Procedure Notes (Signed)
Procedure Name: Intubation Date/Time: 02/05/2018 9:33 AM Performed by: Cynda Familia, CRNA Pre-anesthesia Checklist: Patient identified, Emergency Drugs available, Suction available and Patient being monitored Patient Re-evaluated:Patient Re-evaluated prior to induction Oxygen Delivery Method: Circle System Utilized Preoxygenation: Pre-oxygenation with 100% oxygen Induction Type: IV induction Ventilation: Mask ventilation without difficulty Laryngoscope Size: Miller and 2 Grade View: Grade I Tube type: Oral Number of attempts: 1 Airway Equipment and Method: Stylet and Oral airway Placement Confirmation: ETT inserted through vocal cords under direct vision,  positive ETCO2 and breath sounds checked- equal and bilateral Secured at: 22 cm Tube secured with: Tape Dental Injury: Teeth and Oropharynx as per pre-operative assessment  Comments: Smooth IV induction Ellender--- intubation AM CRNA atraumatic--- teeth and mouth as preop-- bilat BS Ellender

## 2018-02-06 ENCOUNTER — Encounter (HOSPITAL_COMMUNITY): Payer: Self-pay | Admitting: Surgery

## 2018-02-06 DIAGNOSIS — K811 Chronic cholecystitis: Secondary | ICD-10-CM | POA: Diagnosis not present

## 2018-02-06 NOTE — Progress Notes (Signed)
Assessment unchanged. Pt verbalized understanding of dc instructions through teach back including follow up care, when to call the doctor, and meds to resume. No scripts at dc. Discharged via wc accompanied by NT and friend.

## 2018-02-06 NOTE — Discharge Summary (Signed)
Physician Discharge Summary [facility]  Patient ID: Linda Mata MRN: [id number] DOB/AGE: 10/21/1960 57 y.o.  Admit date: 02/05/2018 Discharge date: 02/06/2018  Admission Diagnoses:  Biliary dyskinesia, abdominal pain  Discharge Diagnoses:  Principal Problem:   Biliary dyskinesia   Discharged Condition: good  Hospital Course: Patient was admitted for observation following gallbladder surgery.  Post op course was uncomplicated.  Pain was well controlled.  Tolerated diet.  Patient was prepared for discharge home on POD#1.  Consults: None  Treatments: surgery: lap chole with iOC  Discharge Exam: Blood pressure 120/71, pulse 70, temperature 98.6 F (37 C), resp. rate 16, height 5\' 6"  (1.676 m), weight 110.8 kg (244 lb 4 oz), SpO2 95 %. HEENT - clear Neck - soft Chest - clear bilaterally Cor - RRR Abd - soft without distension; dressings dry and intact; minimal tenderness   Disposition: Home  Discharge Instructions    Diet - low sodium heart healthy   Complete by:  As directed    Discharge instructions   Complete by:  As directed    [facility]  LAPAROSCOPIC SURGERY:  POST-OP INSTRUCTIONS  Always review your discharge instruction sheet given to you by the facility where your surgery was performed.  A prescription for pain medication may be given to you upon discharge.  Take your pain medication as prescribed.  If narcotic pain medicine is not needed, then you may take acetaminophen (Tylenol) or ibuprofen (Advil) as needed.  Take your usually prescribed medications unless otherwise directed.  If you need a refill on your pain medication, please contact your pharmacy.  They will contact our office to request authorization. Prescriptions will not be filled after 5 P.M. or on weekends.  You should follow a light diet the first few days after arrival home, such as soup and crackers or toast.  Be sure to include plenty of fluids  daily.  Most patients will experience some swelling and bruising in the area of the incisions.  Ice packs will help.  Swelling and bruising can take several days to resolve.   It is common to experience some constipation after surgery.  Increasing fluid intake and taking a stool softener (such as Colace) will usually help or prevent this problem from occurring.  A mild laxative (Milk of Magnesia or Miralax) should be taken according to package instructions if there has been no bowel movement after 48 hours.  You will have steri-strips and a gauze dressing over your incisions.  You may remove the gauze bandage on the second day after surgery, and you may shower at that time.  Leave your steri-strips (small skin tapes) in place directly over the incision.  These strips should remain on the skin for 5-7 days and then be removed.  You may get them wet in the shower and pat them dry.  Any sutures or staples will be removed at the office during your follow-up visit.  ACTIVITIES:  You may resume regular (light) daily activities beginning the next day - such as daily self-care, walking, climbing stairs - gradually increasing activities as tolerated.  You may have sexual intercourse when it is comfortable.  Refrain from any heavy lifting or straining until approved by your doctor.  You may drive when you are no longer taking prescription pain medication, you can comfortably wear a seatbelt, and you can safely maneuver your car and apply brakes.  You should see your doctor in the office for a follow-up appointment approximately  2-3 weeks after your surgery.  Make sure that you call for this appointment within a day or two after you arrive home to insure a convenient appointment time.  WHEN TO CALL YOUR DOCTOR: Fever over 101.0 Inability to urinate Continued bleeding from incision Increased pain, redness, or drainage from the incision Increasing abdominal pain  The clinic staff is available to answer  your questions during regular business hours.  Please don't hesitate to call and ask to speak to one of the nurses for clinical concerns.  If you have a medical emergency, go to the nearest emergency room or call 911.  A surgeon from [facility] is always on call for the hospital.  Earnstine Regal, MD, [facility] Office: [place] Free:  [place] [phone]  Website: [email or web address]   Increase activity slowly   Complete by:  As directed    Remove dressing in 24 hours   Complete by:  As directed      Allergies as of 02/06/2018      Reactions   Dexilant [dexlansoprazole]    Mental status changes      Medication List    TAKE these medications   aspirin 325 MG tablet Take 325 mg by mouth every 6 (six) hours as needed.   CALCIUM-MAGNESIUM-VITAMIN D PO Take 1 tablet by mouth daily.   FIBER-CAPS PO Take 2-3 capsules by mouth daily.   milk thistle 175 MG tablet Take 175 mg by mouth daily.   PROBIOTIC DAILY PO Take 1 capsule by mouth daily.   valACYclovir 500 MG tablet Commonly known as:  VALTREX Take 250 mg by mouth daily.      Follow-up Information    Armandina Gemma, MD. Schedule an appointment as soon as possible for a visit in 3 week(s).   Specialty:  General Surgery Contact information: [address] [place] [address] [phone]           Earnstine Regal, MD, [facility] Office: [phone]   Signed: Earnstine Regal 02/06/2018, 10:30 AM

## 2018-02-06 NOTE — Discharge Instructions (Signed)

## 2019-06-22 IMAGING — NM NM HEPATO W/GB/PHARM/[PERSON_NAME]
2 series · 12 of 12 positions shown · non-contrast
Comparison: None.

CLINICAL DATA: Right upper quadrant pain.

EXAM:
NUCLEAR MEDICINE HEPATOBILIARY IMAGING WITH GALLBLADDER EF
TECHNIQUE: Sequential images of the abdomen were obtained [DATE] minutes
following intravenous administration of radiopharmaceutical. After
oral ingestion of Ensure, gallbladder ejection fraction was
determined. At 60 min, normal ejection fraction is greater than 33%.
RADIOPHARMACEUTICALS:  5.1 millicuries mCi Hc-AAm  Choletec IV

[dy dynamic · 3.10mm/px · 6 of 60 frames shown (1 of 2)]
[frame 6/60]
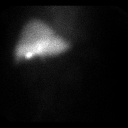
[frame 16/60]
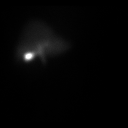
[frame 26/60]
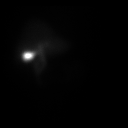
[frame 36/60]
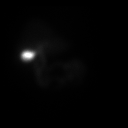
[frame 46/60]
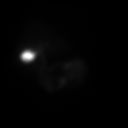
[frame 56/60]
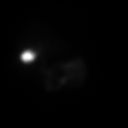

[dy dynamic · 3.10mm/px · 6 of 60 frames shown (2 of 2)]
[frame 6/60]
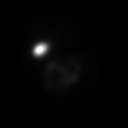
[frame 16/60]
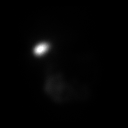
[frame 26/60]
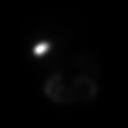
[frame 36/60]
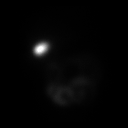
[frame 46/60]
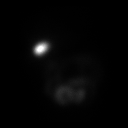
[frame 56/60]
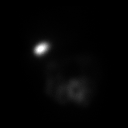

[12 of 12 positions shown; findings below may reference images not displayed]

FINDINGS: Prompt uptake and biliary excretion of activity by the liver is
seen. Gallbladder activity is visualized, consistent with patency of
cystic duct. Biliary activity passes into small bowel, consistent
with patent common bile duct.

Calculated gallbladder ejection fraction is 27%%. (Normal
gallbladder ejection fraction with Ensure is greater than 33%.)
IMPRESSION: Abnormally diminished gallbladder ejection fraction of 27%. This
could be due to biliary dyskinesis and relate to the patient's
symptoms in the appropriate clinical setting.

## 2019-10-14 ENCOUNTER — Encounter (HOSPITAL_COMMUNITY): Payer: Self-pay | Admitting: Emergency Medicine

## 2019-10-14 ENCOUNTER — Inpatient Hospital Stay (HOSPITAL_COMMUNITY)
Admission: EM | Admit: 2019-10-14 | Discharge: 2019-10-16 | DRG: 177 | Disposition: A | Discharge status: Home / Self Care | Payer: BC Managed Care – PPO | Attending: Internal Medicine | Admitting: Internal Medicine

## 2019-10-14 ENCOUNTER — Emergency Department (HOSPITAL_COMMUNITY): Payer: BC Managed Care – PPO

## 2019-10-14 ENCOUNTER — Other Ambulatory Visit: Payer: Self-pay

## 2019-10-14 DIAGNOSIS — E785 Hyperlipidemia, unspecified: Secondary | ICD-10-CM | POA: Diagnosis present

## 2019-10-14 DIAGNOSIS — Z7982 Long term (current) use of aspirin: Secondary | ICD-10-CM

## 2019-10-14 DIAGNOSIS — J1282 Pneumonia due to coronavirus disease 2019: Secondary | ICD-10-CM | POA: Diagnosis present

## 2019-10-14 DIAGNOSIS — Z86010 Personal history of colonic polyps: Secondary | ICD-10-CM

## 2019-10-14 DIAGNOSIS — Z6841 Body Mass Index (BMI) 40.0 and over, adult: Secondary | ICD-10-CM

## 2019-10-14 DIAGNOSIS — Z87891 Personal history of nicotine dependence: Secondary | ICD-10-CM

## 2019-10-14 DIAGNOSIS — Z83438 Family history of other disorder of lipoprotein metabolism and other lipidemia: Secondary | ICD-10-CM

## 2019-10-14 DIAGNOSIS — Z823 Family history of stroke: Secondary | ICD-10-CM

## 2019-10-14 DIAGNOSIS — K219 Gastro-esophageal reflux disease without esophagitis: Secondary | ICD-10-CM | POA: Diagnosis present

## 2019-10-14 DIAGNOSIS — J9601 Acute respiratory failure with hypoxia: Secondary | ICD-10-CM | POA: Diagnosis present

## 2019-10-14 DIAGNOSIS — U071 COVID-19: Principal | ICD-10-CM | POA: Diagnosis present

## 2019-10-14 DIAGNOSIS — G43909 Migraine, unspecified, not intractable, without status migrainosus: Secondary | ICD-10-CM | POA: Diagnosis present

## 2019-10-14 DIAGNOSIS — Z79899 Other long term (current) drug therapy: Secondary | ICD-10-CM

## 2019-10-14 DIAGNOSIS — R9431 Abnormal electrocardiogram [ECG] [EKG]: Secondary | ICD-10-CM

## 2019-10-14 DIAGNOSIS — F329 Major depressive disorder, single episode, unspecified: Secondary | ICD-10-CM | POA: Diagnosis present

## 2019-10-14 DIAGNOSIS — Z803 Family history of malignant neoplasm of breast: Secondary | ICD-10-CM

## 2019-10-14 DIAGNOSIS — Z8249 Family history of ischemic heart disease and other diseases of the circulatory system: Secondary | ICD-10-CM

## 2019-10-14 DIAGNOSIS — Z888 Allergy status to other drugs, medicaments and biological substances status: Secondary | ICD-10-CM

## 2019-10-14 DIAGNOSIS — F419 Anxiety disorder, unspecified: Secondary | ICD-10-CM | POA: Diagnosis present

## 2019-10-14 LAB — CBC WITH DIFFERENTIAL/PLATELET
Abs Immature Granulocytes: 0.24 10*3/uL — ABNORMAL HIGH (ref 0.00–0.07)
Basophils Absolute: 0 10*3/uL (ref 0.0–0.1)
Basophils Relative: 0 %
Eosinophils Absolute: 0.2 10*3/uL (ref 0.0–0.5)
Eosinophils Relative: 2 %
HCT: 46.2 % — ABNORMAL HIGH (ref 36.0–46.0)
Hemoglobin: 14.5 g/dL (ref 12.0–15.0)
Immature Granulocytes: 2 %
Lymphocytes Relative: 24 %
Lymphs Abs: 2.5 10*3/uL (ref 0.7–4.0)
MCH: 27.2 pg (ref 26.0–34.0)
MCHC: 31.4 g/dL (ref 30.0–36.0)
MCV: 86.7 fL (ref 80.0–100.0)
Monocytes Absolute: 1.1 10*3/uL — ABNORMAL HIGH (ref 0.1–1.0)
Monocytes Relative: 11 %
Neutro Abs: 6.4 10*3/uL (ref 1.7–7.7)
Neutrophils Relative %: 61 %
Platelets: 441 10*3/uL — ABNORMAL HIGH (ref 150–400)
RBC: 5.33 MIL/uL — ABNORMAL HIGH (ref 3.87–5.11)
RDW: 13.2 % (ref 11.5–15.5)
WBC: 10.4 10*3/uL (ref 4.0–10.5)
nRBC: 0 % (ref 0.0–0.2)

## 2019-10-14 LAB — COMPREHENSIVE METABOLIC PANEL
ALT: 23 U/L (ref 0–44)
AST: 28 U/L (ref 15–41)
Albumin: 3.1 g/dL — ABNORMAL LOW (ref 3.5–5.0)
Alkaline Phosphatase: 63 U/L (ref 38–126)
Anion gap: 12 (ref 5–15)
BUN: 14 mg/dL (ref 6–20)
CO2: 25 mmol/L (ref 22–32)
Calcium: 9 mg/dL (ref 8.9–10.3)
Chloride: 102 mmol/L (ref 98–111)
Creatinine, Ser: 0.91 mg/dL (ref 0.44–1.00)
GFR calc Af Amer: >60 mL/min (ref >60)
GFR calc non Af Amer: >60 mL/min (ref >60)
Glucose, Bld: 105 mg/dL — ABNORMAL HIGH (ref 70–99)
Potassium: 4.5 mmol/L (ref 3.5–5.1)
Sodium: 139 mmol/L (ref 135–145)
Total Bilirubin: 0.5 mg/dL (ref 0.3–1.2)
Total Protein: 7.2 g/dL (ref 6.5–8.1)

## 2019-10-14 LAB — I-STAT BETA HCG BLOOD, ED (MC, WL, AP ONLY): I-stat hCG, quantitative: 5.8 m[IU]/mL — ABNORMAL HIGH (ref <5)

## 2019-10-14 MED ORDER — DEXAMETHASONE SODIUM PHOSPHATE 10 MG/ML IJ SOLN
6.0000 mg | Freq: Once | INTRAMUSCULAR | Status: AC
  Administered 2019-10-15: 6 mg via INTRAVENOUS
  Filled 2019-10-14: qty 1

## 2019-10-14 NOTE — ED Triage Notes (Signed)
Patient tested positive for COVID19  April 1.2021 reports exertional dyspnea , fatigue , generalized weakness and poor appetite this week , denies fever or chills .

## 2019-10-14 NOTE — ED Provider Notes (Signed)
TIME SEEN: 11:28 PM  CHIEF COMPLAINT: Shortness of breath, decreased appetite, Covid positive on 10/06/2019  HPI: Patient is a 59 year old female with history of hyperlipidemia, anxiety, migraines who presents to the emergency department after testing Covid positive on 10/06/2019.  Has had several days of worsening shortness of breath, decreased appetite, fatigue and weakness.  No vomiting or diarrhea.  Unsure of last fever.  Does not wear oxygen chronically.  ROS: See HPI Constitutional: no fever  Eyes: no drainage  ENT: no runny nose   Cardiovascular:  no chest pain  Resp: SOB  GI: no vomiting GU: no dysuria Integumentary: no rash  Allergy: no hives  Musculoskeletal: no leg swelling  Neurological: no slurred speech ROS otherwise negative  PAST MEDICAL HISTORY/PAST SURGICAL HISTORY:  Past Medical History:  Diagnosis Date  . Allergy    metal  . Anxiety   . Asthma    childhood, resolution as teen  . Colon polyp    adenomatous  . Depression   . GERD (gastroesophageal reflux disease)   . Headache(784.0)   . Hyperlipidemia   . Migraine     MEDICATIONS:  Prior to Admission medications   Medication Sig Start Date End Date Taking? Authorizing Provider  aspirin 325 MG tablet Take 325 mg by mouth every 6 (six) hours as needed.    [provider]  Calcium Polycarbophil (FIBER-CAPS PO) Take 2-3 capsules by mouth daily.    [provider]  CALCIUM-MAGNESIUM-VITAMIN D PO Take 1 tablet by mouth daily.    [provider]  milk thistle 175 MG tablet Take 175 mg by mouth daily.    [provider]  Probiotic Product (PROBIOTIC DAILY PO) Take 1 capsule by mouth daily.    [provider]  valACYclovir (VALTREX) 500 MG tablet Take 250 mg by mouth daily.    [provider]    ALLERGIES:  Allergies  Allergen Reactions  . Dexilant [Dexlansoprazole]     Mental status changes    SOCIAL HISTORY:  Social History   Tobacco Use  . Smoking  status: Former Research scientist (life sciences)  . Smokeless tobacco: Never Used  . Tobacco comment: smoked [place], up < 1 ppd  Substance Use Topics  . Alcohol use: No    FAMILY HISTORY: Family History  Problem Relation Age of Onset  . Hyperlipidemia Mother   . Hyperlipidemia Father   . Stroke Father   . Hypertension Father   . Breast cancer Maternal Grandmother   . Breast cancer Maternal Aunt   . Diabetes Neg Hx     EXAM: BP 119/74 (BP Location: Right Arm)   Pulse 91   Temp 99.6 F (37.6 C) (Oral)   Resp 14   Ht 5\' 6"  (1.676 m)   Wt 120 kg   SpO2 90%   BMI 42.70 kg/m  CONSTITUTIONAL: Alert and oriented and responds appropriately to questions. Well-appearing; well-nourished HEAD: Normocephalic EYES: Conjunctivae clear, pupils appear equal, EOM appear intact ENT: normal nose; moist mucous membranes NECK: Supple, normal ROM CARD: RRR; S1 and S2 appreciated; no murmurs, no clicks, no rubs, no gallops RESP: Patient's lungs are clear to auscultation without wheezing, rhonchi or rales.  With any minimal exertion she quickly dropped her oxygen saturation to 88% on room air and becomes tachypneic and speaking short sentences. ABD/GI: Normal bowel sounds; non-distended; soft, non-tender, no rebound, no guarding, no peritoneal signs, no hepatosplenomegaly BACK:  The back appears normal EXT: Normal ROM in all joints; no deformity noted, no edema; no  cyanosis, no calf tenderness or calf swelling SKIN: Normal color for age and race; warm; no rash on exposed skin NEURO: Moves all extremities equally PSYCH: The patient's mood and manner are appropriate.   MEDICAL DECISION MAKING: Patient here with Covid pneumonia on x-ray that has been independently reviewed and interpreted by myself.  Labs have been independently reviewed and interpreted by myself and show no significant abnormality.  EKG reviewed and interpreted by myself with nonspecific inferior T wave changes with no old for comparison.  She is not  having active chest pain.  Patient has respiratory failure with hypoxia.  Does not wear oxygen at home.  Will give IV Decadron.  Will admit to medicine.  PCP is Dr. Jacelyn Grip with [facility].  ED PROGRESS:   12:09 AM Discussed patient's case with hospitalist, Dr. Marlowe Sax.  I have recommended admission and patient (and family if present) agree with this plan. Admitting physician will place admission orders.   I reviewed all nursing notes, vitals, pertinent previous records and interpreted all EKGs, lab and urine results, imaging (as available).     EKG Interpretation  Date/Time:  Friday October 14 2019 19:41:04 EDT Ventricular Rate:  120 PR Interval:  128 QRS Duration: 72 QT Interval:  282 QTC Calculation: 398 R Axis:   70 Text Interpretation: Sinus tachycardia T wave abnormality, consider inferior ischemia Abnormal ECG No old tracing to compare Confirmed by Travian Kerner, Cyril Mourning [phone]) on 10/14/2019 11:28:47 PM      CRITICAL CARE Performed by: Cyril Mourning Zakiah Beckerman   Total critical care time: 45 minutes  Critical care time was exclusive of separately billable procedures and treating other patients.  Critical care was necessary to treat or prevent imminent or life-threatening deterioration.  Critical care was time spent personally by me on the following activities: development of treatment plan with patient and/or surrogate as well as nursing, discussions with consultants, evaluation of patient's response to treatment, examination of patient, obtaining history from patient or surrogate, ordering and performing treatments and interventions, ordering and review of laboratory studies, ordering and review of radiographic studies, pulse oximetry and re-evaluation of patient's condition.   Linda Mata was evaluated in Emergency Department on 10/14/2019 for the symptoms described in the history of present illness. She was evaluated in the context of the global COVID-19 pandemic, which necessitated  consideration that the patient might be at risk for infection with the SARS-CoV-2 virus that causes COVID-19. Institutional protocols and algorithms that pertain to the evaluation of patients at risk for COVID-19 are in a state of rapid change based on information released by regulatory bodies including the CDC and federal and state organizations. These policies and algorithms were followed during the patient's care in the ED.      Linda Mata, Delice Bison, DO 10/15/19 [phone]

## 2019-10-14 NOTE — H&P (Signed)
History and Physical    Linda Mata [id number] DOB: 04-Apr-1961 DOA: 10/14/2019  PCP: Vernie Shanks, MD Patient coming from: Home  Chief Complaint: Shortness of breath, Covid positive  HPI: Linda Mata is a 59 y.o. female with medical history significant of anxiety, depression, GERD, hyperlipidemia presenting to the ED with complaints of shortness of breath.  She has documentation from [facility] from 10/06/2019 confirming a positive COVID-19 rapid antigen test and a negative influenza panel.  Patient states her symptoms started about 2 to 3 days prior to the date of testing.  She is having dyspnea on exertion, fevers, fatigue, and cough.  No chest pain.  She has not been able to eat much.  No nausea, vomiting, abdominal pain, or diarrhea.  She was initially treated with prednisone and azithromycin after she was diagnosed but symptoms have continued to worsen.  ED Course: Afebrile.  Oxygen saturation 91% on room air at rest.  Dropped to 88% with ambulation.  Labs showing no leukocytosis.  LFTs normal.  Lactic acid pending.  Blood cultures pending.  Inflammatory markers pending. Chest x-ray showing patchy and peripheral bilateral pulmonary opacities typical of COVID-19 pneumonia. Patient was given IV Decadron 6 mg.  Review of Systems:  All systems reviewed and apart from history of presenting illness, are negative.  Past Medical History:  Diagnosis Date  . Allergy    metal  . Anxiety   . Asthma    childhood, resolution as teen  . Colon polyp    adenomatous  . Depression   . GERD (gastroesophageal reflux disease)   . Headache(784.0)   . Hyperlipidemia   . Migraine     Past Surgical History:  Procedure Laterality Date  . BREAST BIOPSY  1987    [place], left  . CHOLECYSTECTOMY N/A 02/05/2018   Procedure: LAPAROSCOPIC CHOLECYSTECTOMY WITH INTRAOPERATIVE CHOLANGIOGRAM;  Surgeon: Armandina Gemma, MD;  Location: [facility];  Service: General;  Laterality: N/A;  . ESI   2010(X2),2011,2012   Dr Nelva Bush  . LAPAROSCOPY    . TONSILLECTOMY AND ADENOIDECTOMY    . UTERINE FIBROID SURGERY  2002     reports that she has quit smoking. She has never used smokeless tobacco. She reports that she does not drink alcohol or use drugs.  Allergies  Allergen Reactions  . Dexilant [Dexlansoprazole]     Mental status changes    Family History  Problem Relation Age of Onset  . Hyperlipidemia Mother   . Hyperlipidemia Father   . Stroke Father   . Hypertension Father   . Breast cancer Maternal Grandmother   . Breast cancer Maternal Aunt   . Diabetes Neg Hx     Prior to Admission medications   Medication Sig Start Date End Date Taking? Authorizing Provider  aspirin 325 MG tablet Take 325 mg by mouth every 6 (six) hours as needed.    [provider]  Calcium Polycarbophil (FIBER-CAPS PO) Take 2-3 capsules by mouth daily.    [provider]  CALCIUM-MAGNESIUM-VITAMIN D PO Take 1 tablet by mouth daily.    [provider]  milk thistle 175 MG tablet Take 175 mg by mouth daily.    [provider]  Probiotic Product (PROBIOTIC DAILY PO) Take 1 capsule by mouth daily.    [provider]  valACYclovir (VALTREX) 500 MG tablet Take 250 mg by mouth daily.    [provider]    Physical Exam: Vitals:   10/14/19 2257 10/14/19 2300 10/14/19 2315 10/14/19  2345  BP: 119/74 126/78 122/79 104/76  Pulse: 91 85 83 90  Resp: 14 15 12 16   Temp: 99.6 F (37.6 C)     TempSrc: Oral     SpO2: 90% 91% 91% 91%  Weight:      Height:        Physical Exam  Constitutional: She is oriented to person, place, and time. She appears well-developed and well-nourished. No distress.  Appears lethargic  HENT:  Head: Normocephalic.  Eyes: Right eye exhibits no discharge. Left eye exhibits no discharge.  Cardiovascular: Normal rate, regular rhythm and intact distal pulses.  Pulmonary/Chest: Effort normal. She has no wheezes.  Oxygen  saturation in the mid 90s on 2 L supplemental oxygen Equal air entry bilaterally  Abdominal: Soft. Bowel sounds are normal. She exhibits no distension. There is no abdominal tenderness. There is no guarding.  Musculoskeletal:        General: No edema.     Cervical back: Neck supple.  Neurological: She is alert and oriented to person, place, and time.  Skin: Skin is warm and dry. She is not diaphoretic.     Labs on Admission: I have personally reviewed following labs and imaging studies  CBC: Recent Labs  Lab 10/14/19 1957  WBC 10.4  NEUTROABS 6.4  HGB 14.5  HCT 46.2*  MCV 86.7  PLT [id number]*   Basic Metabolic Panel: Recent Labs  Lab 10/14/19 1957  NA 139  K 4.5  CL 102  CO2 25  GLUCOSE 105*  BUN 14  CREATININE 0.91  CALCIUM 9.0   GFR: Estimated Creatinine Clearance: 88.9 mL/min (by C-G formula based on SCr of 0.91 mg/dL). Liver Function Tests: Recent Labs  Lab 10/14/19 1957  AST 28  ALT 23  ALKPHOS 63  BILITOT 0.5  PROT 7.2  ALBUMIN 3.1*   No results for input(s): LIPASE, AMYLASE in the last 168 hours. No results for input(s): AMMONIA in the last 168 hours. Coagulation Profile: No results for input(s): INR, PROTIME in the last 168 hours. Cardiac Enzymes: No results for input(s): CKTOTAL, CKMB, CKMBINDEX, TROPONINI in the last 168 hours. BNP (last 3 results) No results for input(s): PROBNP in the last 8760 hours. HbA1C: No results for input(s): HGBA1C in the last 72 hours. CBG: No results for input(s): GLUCAP in the last 168 hours. Lipid Profile: No results for input(s): CHOL, HDL, LDLCALC, TRIG, CHOLHDL, LDLDIRECT in the last 72 hours. Thyroid Function Tests: No results for input(s): TSH, T4TOTAL, FREET4, T3FREE, THYROIDAB in the last 72 hours. Anemia Panel: No results for input(s): VITAMINB12, FOLATE, FERRITIN, TIBC, IRON, RETICCTPCT in the last 72 hours. Urine analysis:    Component Value Date/Time   COLORURINE LT YELLOW 04/08/2007 1456    APPEARANCEUR Clear 04/08/2007 1456   LABSPEC 1.010 04/08/2007 1456   PHURINE 6.0 04/08/2007 1456   GLUCOSEU NEGATIVE 04/08/2007 1456   BILIRUBINUR Neg 01/07/2012 1415   KETONESUR NEGATIVE 04/08/2007 1456   PROTEINUR Neg 01/07/2012 1415   UROBILINOGEN 0.2 01/07/2012 1415   UROBILINOGEN 0.2 mg/dL 04/08/2007 1456   NITRITE Neg 01/07/2012 1415   NITRITE Negative 04/08/2007 1456   LEUKOCYTESUR Negative 01/07/2012 1415    Radiological Exams on Admission: DG Chest Portable 1 View  Result Date: 10/14/2019 CLINICAL DATA:  59 year old female positive COVID-19., weakness and shortness of breath. EXAM: PORTABLE CHEST 1 VIEW COMPARISON:  Chest radiographs 08/15/2016 and earlier. FINDINGS: Portable AP upright view at 2247 hours. Lower lung volumes. Mediastinal contours remain normal. Visualized tracheal air column is  within normal limits. There is patchy and indistinct peripheral opacity in both lungs, greater on the right. No superimposed pneumothorax or pleural effusion. Negative visible bowel gas pattern. No osseous abnormality identified. IMPRESSION: Patchy and peripheral bilateral pulmonary opacity typical of COVID-19 pneumonia. No pleural effusion. Electronically Signed   By: Genevie Ann M.D.   On: 10/14/2019 23:00    EKG: Independently reviewed.  Sinus tachycardia, T wave abnormality in inferior and lateral leads.  No prior EKG for comparison.  Assessment/Plan Principal Problem:   Pneumonia due to COVID-19 virus Active Problems:   Acute respiratory failure with hypoxia (HCC)   Abnormal EKG   Acute hypoxic respiratory failure secondary to COVID-19 viral multifocal pneumonia: Tested positive for COVID-19 on 10/06/2019.  Afebrile.  Oxygen saturation 88% with ambulation, improved with 2 L supplemental oxygen.  Labs showing no leukocytosis.  LFTs normal.  Chest x-ray showing patchy and peripheral bilateral pulmonary opacities typical of COVID-19 pneumonia. -Remdesivir dosing per pharmacy -IV Decadron 6  mg daily -Vitamin C, zinc -Self proning for 2 to 3 hours every 12 hours -Incentive spirometry, flutter valve -Antitussives as needed -Tylenol as needed -Albuterol inhaler as needed -Lactic acid level pending -Inflammatory markers including ferritin, fibrinogen, D-dimer, CRP, LDH pending -Procalcitonin level pending -Airborne and contact precautions -Continuous pulse ox -Supplemental oxygen as needed to keep oxygen saturation above 90% -Blood culture x2 pending  Abnormal EKG: EKG with T wave abnormality in inferior and lateral leads.  No prior tracing for comparison.  Patient denies chest pain and appears comfortable.  Continue cardiac monitoring and check high-sensitivity troponin level.  DVT prophylaxis: Lovenox Code Status: Full code Family Communication: No family available at this time. Disposition Plan: Anticipate discharge after clinical improvement. Consults called: None Admission status: It is my clinical opinion that admission to INPATIENT is reasonable and necessary because of the expectation that this patient will require hospital care that crosses at least 2 midnights to treat this condition based on the medical complexity of the problems presented.  Given the aforementioned information, the predictability of an adverse outcome is felt to be significant.  The medical decision making on this patient was of high complexity and the patient is at high risk for clinical deterioration, therefore this is a level 3 visit.  Shela Leff MD [facility]  If 7PM-7AM, please contact night-coverage [email or web address]  10/15/2019, 12:38 AM

## 2019-10-14 NOTE — ED Notes (Signed)
Pt O2sat was 91 while pt was laying down and while sitting on the edge of the bed. Upon ambulation, O2sat dropped to 89 and ranged from 89 to 94 as pt walked around room. Lowest saturation was 88. No reports of dizziness, or faintness. Pt did report SOB with ambulation.

## 2019-10-14 NOTE — ED Notes (Signed)
ED Provider at bedside. 

## 2019-10-15 DIAGNOSIS — Z6841 Body Mass Index (BMI) 40.0 and over, adult: Secondary | ICD-10-CM | POA: Diagnosis not present

## 2019-10-15 DIAGNOSIS — Z823 Family history of stroke: Secondary | ICD-10-CM | POA: Diagnosis not present

## 2019-10-15 DIAGNOSIS — R9431 Abnormal electrocardiogram [ECG] [EKG]: Secondary | ICD-10-CM

## 2019-10-15 DIAGNOSIS — E785 Hyperlipidemia, unspecified: Secondary | ICD-10-CM | POA: Diagnosis present

## 2019-10-15 DIAGNOSIS — Z7982 Long term (current) use of aspirin: Secondary | ICD-10-CM | POA: Diagnosis not present

## 2019-10-15 DIAGNOSIS — Z79899 Other long term (current) drug therapy: Secondary | ICD-10-CM | POA: Diagnosis not present

## 2019-10-15 DIAGNOSIS — F329 Major depressive disorder, single episode, unspecified: Secondary | ICD-10-CM | POA: Diagnosis present

## 2019-10-15 DIAGNOSIS — J9601 Acute respiratory failure with hypoxia: Secondary | ICD-10-CM | POA: Diagnosis present

## 2019-10-15 DIAGNOSIS — F419 Anxiety disorder, unspecified: Secondary | ICD-10-CM | POA: Diagnosis present

## 2019-10-15 DIAGNOSIS — K219 Gastro-esophageal reflux disease without esophagitis: Secondary | ICD-10-CM | POA: Diagnosis present

## 2019-10-15 DIAGNOSIS — Z87891 Personal history of nicotine dependence: Secondary | ICD-10-CM | POA: Diagnosis not present

## 2019-10-15 DIAGNOSIS — Z83438 Family history of other disorder of lipoprotein metabolism and other lipidemia: Secondary | ICD-10-CM | POA: Diagnosis not present

## 2019-10-15 DIAGNOSIS — U071 COVID-19: Secondary | ICD-10-CM

## 2019-10-15 DIAGNOSIS — G43909 Migraine, unspecified, not intractable, without status migrainosus: Secondary | ICD-10-CM | POA: Diagnosis present

## 2019-10-15 DIAGNOSIS — J1282 Pneumonia due to coronavirus disease 2019: Secondary | ICD-10-CM | POA: Diagnosis present

## 2019-10-15 DIAGNOSIS — Z8249 Family history of ischemic heart disease and other diseases of the circulatory system: Secondary | ICD-10-CM | POA: Diagnosis not present

## 2019-10-15 DIAGNOSIS — Z803 Family history of malignant neoplasm of breast: Secondary | ICD-10-CM | POA: Diagnosis not present

## 2019-10-15 DIAGNOSIS — Z86010 Personal history of colonic polyps: Secondary | ICD-10-CM | POA: Diagnosis not present

## 2019-10-15 DIAGNOSIS — Z888 Allergy status to other drugs, medicaments and biological substances status: Secondary | ICD-10-CM | POA: Diagnosis not present

## 2019-10-15 LAB — HIV ANTIBODY (ROUTINE TESTING W REFLEX): HIV Screen 4th Generation wRfx: NONREACTIVE

## 2019-10-15 LAB — HCG, QUANTITATIVE, PREGNANCY: hCG, Beta Chain, Quant, S: 6 m[IU]/mL — ABNORMAL HIGH (ref <5)

## 2019-10-15 LAB — CBC
HCT: 43.8 % (ref 36.0–46.0)
Hemoglobin: 13.9 g/dL (ref 12.0–15.0)
MCH: 26.9 pg (ref 26.0–34.0)
MCHC: 31.7 g/dL (ref 30.0–36.0)
MCV: 84.7 fL (ref 80.0–100.0)
Platelets: 393 10*3/uL (ref 150–400)
RBC: 5.17 MIL/uL — ABNORMAL HIGH (ref 3.87–5.11)
RDW: 13.1 % (ref 11.5–15.5)
WBC: 6.6 10*3/uL (ref 4.0–10.5)
nRBC: 0 % (ref 0.0–0.2)

## 2019-10-15 LAB — PROCALCITONIN: Procalcitonin: <0.1 ng/mL

## 2019-10-15 LAB — BASIC METABOLIC PANEL
Anion gap: 11 (ref 5–15)
BUN: 14 mg/dL (ref 6–20)
CO2: 24 mmol/L (ref 22–32)
Calcium: 8.8 mg/dL — ABNORMAL LOW (ref 8.9–10.3)
Chloride: 105 mmol/L (ref 98–111)
Creatinine, Ser: 0.77 mg/dL (ref 0.44–1.00)
GFR calc Af Amer: >60 mL/min (ref >60)
GFR calc non Af Amer: >60 mL/min (ref >60)
Glucose, Bld: 162 mg/dL — ABNORMAL HIGH (ref 70–99)
Potassium: 4.6 mmol/L (ref 3.5–5.1)
Sodium: 140 mmol/L (ref 135–145)

## 2019-10-15 LAB — FERRITIN: Ferritin: 180 ng/mL (ref 11–307)

## 2019-10-15 LAB — C-REACTIVE PROTEIN
CRP: 9.8 mg/dL — ABNORMAL HIGH (ref <1.0)
CRP: 13.2 mg/dL — ABNORMAL HIGH (ref <1.0)

## 2019-10-15 LAB — MAGNESIUM: Magnesium: 2.1 mg/dL (ref 1.7–2.4)

## 2019-10-15 LAB — TRIGLYCERIDES: Triglycerides: 74 mg/dL (ref <150)

## 2019-10-15 LAB — LACTIC ACID, PLASMA: Lactic Acid, Venous: 1.2 mmol/L (ref 0.5–1.9)

## 2019-10-15 LAB — D-DIMER, QUANTITATIVE
D-Dimer, Quant: 1.87 ug/mL-FEU — ABNORMAL HIGH (ref 0.00–0.50)
D-Dimer, Quant: 1.93 ug/mL-FEU — ABNORMAL HIGH (ref 0.00–0.50)

## 2019-10-15 LAB — TROPONIN I (HIGH SENSITIVITY): Troponin I (High Sensitivity): 3 ng/L (ref <18)

## 2019-10-15 LAB — FIBRINOGEN: Fibrinogen: 670 mg/dL — ABNORMAL HIGH (ref 210–475)

## 2019-10-15 LAB — BRAIN NATRIURETIC PEPTIDE: B Natriuretic Peptide: 11.9 pg/mL (ref 0.0–100.0)

## 2019-10-15 LAB — LACTATE DEHYDROGENASE: LDH: 308 U/L — ABNORMAL HIGH (ref 98–192)

## 2019-10-15 LAB — ABO/RH: ABO/RH(D): O POS

## 2019-10-15 MED ORDER — ENOXAPARIN SODIUM 40 MG/0.4ML ~~LOC~~ SOLN
40.0000 mg | Freq: Every day | SUBCUTANEOUS | Status: DC
  Administered 2019-10-15: 40 mg via SUBCUTANEOUS
  Filled 2019-10-15: qty 0.4

## 2019-10-15 MED ORDER — ALBUTEROL SULFATE HFA 108 (90 BASE) MCG/ACT IN AERS
2.0000 | INHALATION_SPRAY | Freq: Four times a day (QID) | RESPIRATORY_TRACT | Status: DC | PRN
  Filled 2019-10-15: qty 6.7

## 2019-10-15 MED ORDER — ZINC SULFATE 220 (50 ZN) MG PO CAPS
220.0000 mg | ORAL_CAPSULE | Freq: Every day | ORAL | Status: DC
  Administered 2019-10-15 – 2019-10-16 (×2): 220 mg via ORAL
  Filled 2019-10-15 (×2): qty 1

## 2019-10-15 MED ORDER — ENOXAPARIN SODIUM 60 MG/0.6ML ~~LOC~~ SOLN
60.0000 mg | Freq: Every day | SUBCUTANEOUS | Status: DC
  Administered 2019-10-16: 60 mg via SUBCUTANEOUS
  Filled 2019-10-15: qty 0.6

## 2019-10-15 MED ORDER — DEXAMETHASONE SODIUM PHOSPHATE 10 MG/ML IJ SOLN
6.0000 mg | INTRAMUSCULAR | Status: DC
  Administered 2019-10-16: 6 mg via INTRAVENOUS
  Filled 2019-10-15: qty 1

## 2019-10-15 MED ORDER — SODIUM CHLORIDE 0.9 % IV SOLN
100.0000 mg | INTRAVENOUS | Status: AC
  Administered 2019-10-15: 100 mg via INTRAVENOUS
  Filled 2019-10-15 (×2): qty 20

## 2019-10-15 MED ORDER — SODIUM CHLORIDE 0.9 % IV SOLN
100.0000 mg | Freq: Once | INTRAVENOUS | Status: AC
  Administered 2019-10-15: 100 mg via INTRAVENOUS

## 2019-10-15 MED ORDER — ACETAMINOPHEN 325 MG PO TABS: 650.0000 mg | ORAL_TABLET | Freq: Four times a day (QID) | ORAL | Status: DC | PRN

## 2019-10-15 MED ORDER — ASCORBIC ACID 500 MG PO TABS
500.0000 mg | ORAL_TABLET | Freq: Every day | ORAL | Status: DC
  Administered 2019-10-15 – 2019-10-16 (×2): 500 mg via ORAL
  Filled 2019-10-15 (×2): qty 1

## 2019-10-15 MED ORDER — HYDROCOD POLST-CPM POLST ER 10-8 MG/5ML PO SUER: 5.0000 mL | Freq: Two times a day (BID) | ORAL | Status: DC | PRN

## 2019-10-15 MED ORDER — GUAIFENESIN-DM 100-10 MG/5ML PO SYRP
10.0000 mL | ORAL_SOLUTION | ORAL | Status: DC | PRN
  Administered 2019-10-16: 10 mL via ORAL
  Filled 2019-10-15: qty 10

## 2019-10-15 MED ORDER — SODIUM CHLORIDE 0.9 % IV SOLN
100.0000 mg | Freq: Every day | INTRAVENOUS | Status: DC
  Administered 2019-10-16: 100 mg via INTRAVENOUS
  Filled 2019-10-15: qty 20

## 2019-10-15 NOTE — ED Notes (Signed)
Admitting MD at bedside.

## 2019-10-15 NOTE — ED Notes (Signed)
Pt transported to 5W with RN with all belongings.

## 2019-10-15 NOTE — Progress Notes (Signed)
PROGRESS NOTE                                                                                                                                                                                                             Patient Demographics:    Linda Mata, is a 59 y.o. female, DOB - [birth year], ZJQ:[id number]  Outpatient Primary MD for the patient is Vernie Shanks, MD    LOS - 0  Admit date - 10/14/2019    Chief Complaint  Patient presents with  . Covid+ / SOB /Fatigue       Brief Narrative - Linda Mata is a 59 y.o. female with medical history significant of anxiety, depression, GERD, hyperlipidemia presenting to the ED with complaints of shortness of breath.  She has documentation from [facility] from 10/06/2019 confirming a positive COVID-19 rapid antigen test and a negative influenza panel.  Patient states her symptoms started about 2 to 3 days prior to the date of testing.  She is having dyspnea on exertion, fevers, fatigue, and cough.    Subjective:    [employer] today has, No headache, No chest pain, No abdominal pain - No Nausea, No new weakness tingling or numbness,  Improved  SOB    Assessment  & Plan :     1. Acute Hypoxic Resp. Failure due to Acute Covid 19 Viral Pneumonitis during the ongoing 2020 Covid 19 Pandemic - she has mild to moderate disease has been started on IV steroids and remdesivir combination, continue to titrate inflammatory markers and patient clinically.  Encouraged the patient to sit up in chair in the daytime use I-S and flutter valve for pulmonary toiletry and then prone in bed when at night.  Will advance activity and titrate down oxygen as possible.   SpO2: 94 % O2 Flow Rate (L/min): 2 L/min  Recent Labs  Lab 10/14/19 2354 10/15/19 0808  CRP 9.8* 13.2*  DDIMER 1.87* 1.93*  BNP  --  11.9  PROCALCITON <0.10  --     Hepatic Function Latest Ref Rng & Units  10/14/2019 03/27/2014 02/23/2014  Total Protein 6.5 - 8.1 g/dL 7.2 7.6 7.1  Albumin 3.5 - 5.0 g/dL 3.1(L) 3.8 3.7  AST 15 - 41 U/L 28 26 32  ALT 0 - 44 U/L 23 30 46(H)  Alk Phosphatase 38 -  126 U/L 63 95 89  Total Bilirubin 0.3 - 1.2 mg/dL 0.5 0.5 0.8  Bilirubin, Direct 0.0 - 0.3 mg/dL - 0.2 0.1      2.  Nonspecific EKG changes.  No chest pain, negative HS Troponin.  Outpatient echocardiogram.   Condition - Fair  Family Communication  :  None  Code Status :  Full  Diet :   Diet Order            Diet Heart Room service appropriate? Yes; Fluid consistency: Thin  Diet effective now               Disposition Plan  : Stay in the hospital and finished treatment for COVID-19 pneumonia with IV remdesivir course  Consults  : None  Procedures  :  None  PUD Prophylaxis : None  DVT Prophylaxis  :  Lovenox    Lab Results  Component Value Date   PLT 393 10/15/2019    Inpatient Medications  Scheduled Meds: . vitamin C  500 mg Oral Daily  . [START ON 10/16/2019] dexamethasone (DECADRON) injection  6 mg Intravenous Q24H  . [START ON 10/16/2019] enoxaparin (LOVENOX) injection  60 mg Subcutaneous Daily  . zinc sulfate  220 mg Oral Daily   Continuous Infusions: . [START ON 10/16/2019] remdesivir 100 mg in NS 100 mL     PRN Meds:.acetaminophen, albuterol, chlorpheniramine-HYDROcodone, guaiFENesin-dextromethorphan  Antibiotics  :    Anti-infectives (From admission, onward)   Start     Dose/Rate Route Frequency Ordered Stop   10/16/19 1000  remdesivir 100 mg in sodium chloride 0.9 % 100 mL IVPB     100 mg 200 mL/hr over 30 Minutes Intravenous Daily 10/15/19 0029 10/20/19 0959   10/15/19 0245  remdesivir 100 mg in sodium chloride 0.9 % 100 mL IVPB     100 mg 200 mL/hr over 30 Minutes Intravenous  Once 10/15/19 0234 10/15/19 0312   10/15/19 0100  remdesivir 100 mg in sodium chloride 0.9 % 100 mL IVPB     100 mg 200 mL/hr over 30 Minutes Intravenous Every 30 min 10/15/19 0029  10/15/19 0159       Time Spent in minutes  30   Lala Lund M.D on 10/15/2019 at 1:59 PM  To page go to [email or web address] - password [facility]  [facility] -  Office  [phone]    See all Orders from today for further details    Objective:   Vitals:   10/15/19 0245 10/15/19 0400 10/15/19 0458 10/15/19 0800  BP:  109/64  103/71  Pulse: 78 76  67  Resp: 16 (!) 23  19  Temp: 97.9 F (36.6 C)  97.9 F (36.6 C)   TempSrc: Oral  Axillary   SpO2: 91% 92%  94%  Weight:      Height:        Wt Readings from Last 3 Encounters:  10/14/19 120 kg  02/05/18 110.8 kg  02/04/18 110.8 kg     Intake/Output Summary (Last 24 hours) at 10/15/2019 1359 Last data filed at 10/15/2019 0900 Gross per 24 hour  Intake 320 ml  Output --  Net 320 ml     Physical Exam  Awake Alert, No new F.N deficits, anxious affect [place].AT,PERRAL Supple Neck,No JVD, No cervical lymphadenopathy appriciated.  Symmetrical Chest wall movement, Good air movement bilaterally, CTAB RRR,No Gallops,Rubs or new Murmurs, No Parasternal Heave +ve B.Sounds, Abd Soft, No tenderness, No organomegaly appriciated, No rebound - guarding or rigidity. No Cyanosis, Clubbing or  edema, No new Rash or bruise       Data Review:    CBC Recent Labs  Lab 10/14/19 1957 10/15/19 0808  WBC 10.4 6.6  HGB 14.5 13.9  HCT 46.2* 43.8  PLT 441* 393  MCV 86.7 84.7  MCH 27.2 26.9  MCHC 31.4 31.7  RDW 13.2 13.1  LYMPHSABS 2.5  --   MONOABS 1.1*  --   EOSABS 0.2  --   BASOSABS 0.0  --     Chemistries  Recent Labs  Lab 10/14/19 1957 10/15/19 0808  NA 139 140  K 4.5 4.6  CL 102 105  CO2 25 24  GLUCOSE 105* 162*  BUN 14 14  CREATININE 0.91 0.77  CALCIUM 9.0 8.8*  AST 28  --   ALT 23  --   ALKPHOS 63  --   BILITOT 0.5  --   MG  --  2.1     ------------------------------------------------------------------------------------------------------------------ Recent Labs    10/14/19 2354  TRIG 74    Lab  Results  Component Value Date   HGBA1C 5.5 02/08/2014   ------------------------------------------------------------------------------------------------------------------ No results for input(s): TSH, T4TOTAL, T3FREE, THYROIDAB in the last 72 hours.  Invalid input(s): FREET3  Cardiac Enzymes No results for input(s): CKMB, TROPONINI, MYOGLOBIN in the last 168 hours.  Invalid input(s): CK ------------------------------------------------------------------------------------------------------------------    Component Value Date/Time   BNP 11.9 10/15/2019 0808    Micro Results No results found for this or any previous visit (from the past 240 hour(s)).  Radiology Reports DG Chest Portable 1 View  Result Date: 10/14/2019 CLINICAL DATA:  59 year old female positive COVID-19., weakness and shortness of breath. EXAM: PORTABLE CHEST 1 VIEW COMPARISON:  Chest radiographs 08/15/2016 and earlier. FINDINGS: Portable AP upright view at 2247 hours. Lower lung volumes. Mediastinal contours remain normal. Visualized tracheal air column is within normal limits. There is patchy and indistinct peripheral opacity in both lungs, greater on the right. No superimposed pneumothorax or pleural effusion. Negative visible bowel gas pattern. No osseous abnormality identified. IMPRESSION: Patchy and peripheral bilateral pulmonary opacity typical of COVID-19 pneumonia. No pleural effusion. Electronically Signed   By: Genevie Ann M.D.   On: 10/14/2019 23:00

## 2019-10-16 LAB — COMPREHENSIVE METABOLIC PANEL
ALT: 28 U/L (ref 0–44)
AST: 25 U/L (ref 15–41)
Albumin: 2.5 g/dL — ABNORMAL LOW (ref 3.5–5.0)
Alkaline Phosphatase: 53 U/L (ref 38–126)
Anion gap: 10 (ref 5–15)
BUN: 21 mg/dL — ABNORMAL HIGH (ref 6–20)
CO2: 23 mmol/L (ref 22–32)
Calcium: 8.6 mg/dL — ABNORMAL LOW (ref 8.9–10.3)
Chloride: 107 mmol/L (ref 98–111)
Creatinine, Ser: 0.74 mg/dL (ref 0.44–1.00)
GFR calc Af Amer: >60 mL/min (ref >60)
GFR calc non Af Amer: >60 mL/min (ref >60)
Glucose, Bld: 111 mg/dL — ABNORMAL HIGH (ref 70–99)
Potassium: 4.1 mmol/L (ref 3.5–5.1)
Sodium: 140 mmol/L (ref 135–145)
Total Bilirubin: 0.2 mg/dL — ABNORMAL LOW (ref 0.3–1.2)
Total Protein: 6.1 g/dL — ABNORMAL LOW (ref 6.5–8.1)

## 2019-10-16 LAB — CBC WITH DIFFERENTIAL/PLATELET
Abs Immature Granulocytes: 0.27 10*3/uL — ABNORMAL HIGH (ref 0.00–0.07)
Basophils Absolute: 0 10*3/uL (ref 0.0–0.1)
Basophils Relative: 0 %
Eosinophils Absolute: 0.1 10*3/uL (ref 0.0–0.5)
Eosinophils Relative: 1 %
HCT: 40.7 % (ref 36.0–46.0)
Hemoglobin: 13.2 g/dL (ref 12.0–15.0)
Immature Granulocytes: 2 %
Lymphocytes Relative: 19 %
Lymphs Abs: 2.2 10*3/uL (ref 0.7–4.0)
MCH: 27.7 pg (ref 26.0–34.0)
MCHC: 32.4 g/dL (ref 30.0–36.0)
MCV: 85.5 fL (ref 80.0–100.0)
Monocytes Absolute: 1 10*3/uL (ref 0.1–1.0)
Monocytes Relative: 9 %
Neutro Abs: 8.2 10*3/uL — ABNORMAL HIGH (ref 1.7–7.7)
Neutrophils Relative %: 69 %
Platelets: 426 10*3/uL — ABNORMAL HIGH (ref 150–400)
RBC: 4.76 MIL/uL (ref 3.87–5.11)
RDW: 13.2 % (ref 11.5–15.5)
WBC: 11.8 10*3/uL — ABNORMAL HIGH (ref 4.0–10.5)
nRBC: 0 % (ref 0.0–0.2)

## 2019-10-16 LAB — BRAIN NATRIURETIC PEPTIDE: B Natriuretic Peptide: 14.7 pg/mL (ref 0.0–100.0)

## 2019-10-16 LAB — C-REACTIVE PROTEIN: CRP: 9.5 mg/dL — ABNORMAL HIGH (ref <1.0)

## 2019-10-16 LAB — MAGNESIUM: Magnesium: 2.1 mg/dL (ref 1.7–2.4)

## 2019-10-16 LAB — D-DIMER, QUANTITATIVE: D-Dimer, Quant: 1.67 ug/mL-FEU — ABNORMAL HIGH (ref 0.00–0.50)

## 2019-10-16 MED ORDER — DEXAMETHASONE 6 MG PO TABS: 6.0000 mg | ORAL_TABLET | Freq: Every day | ORAL | 0 refills | Status: AC

## 2019-10-16 MED ORDER — APIXABAN 2.5 MG PO TABS: 2.5000 mg | ORAL_TABLET | Freq: Two times a day (BID) | ORAL | 0 refills | Status: DC

## 2019-10-16 MED ORDER — PREDNISONE 5 MG PO TABS: ORAL_TABLET | ORAL | 0 refills | Status: DC

## 2019-10-16 MED ORDER — ALBUTEROL SULFATE HFA 108 (90 BASE) MCG/ACT IN AERS: 2.0000 | INHALATION_SPRAY | Freq: Four times a day (QID) | RESPIRATORY_TRACT | 0 refills | Status: AC | PRN

## 2019-10-16 NOTE — Progress Notes (Signed)
SATURATION QUALIFICATIONS: (This note is used to comply with regulatory documentation for home oxygen)  Patient Saturations on Room Air at Rest  92%  Patient Saturations on Room Air while Ambulating = the lowest oxygen saturation was 88% on room air, but recover to 92% will ambulating down the hall 50 ft.  No complaints of shortness of breath, or discomfort.

## 2019-10-16 NOTE — Progress Notes (Signed)
Per patient, she does not have a legal guardian. Confirmed with patient and Jacqulyn Bath, Therapist, sports.

## 2019-10-16 NOTE — Progress Notes (Signed)
Patient was seen and examined for second opinion, she is  comfortable, no respiratory distress, oxygen saturation 98 to 100% room air at rest throughout entire evaluation, chest x-ray is clear, no evidence of volume overload, no wheezing, no respiratory distress, she speaks in full sentences, in no apparent distress, she is not orthostatic, no hypoxia with activity, patient appears to be anxious, have discussed with her that her vital signs are stable no respiratory distress, no evidence of volume overload, patient appears to be stable for discharge. -Patient's nurse Jacqulyn Bath was present through entire evaluation. Phillips Climes MD

## 2019-10-16 NOTE — Progress Notes (Signed)
Patient scheduled for outpatient Remdesivir infusion at 11:30 AM on Monday 4/12, Tuesday 4/13 and Wednesday 4/14.  Please advise them to report to [facility] at [address].  Drive to the security guard and tell them you are here for an infusion. They will direct you to the front entrance where we will come and get you.  For questions call [phone].  Thanks   

## 2019-10-16 NOTE — Care Management (Signed)
Dr Merita Norton requested me to speak with patient. He states that she is stable to DC, per him and another physician who agrees. Dr Candiss Norse states she does not need oxygen and has been on RA.  However, patient stating she will not DC.   Spoke w patient over the phone. She states that she is agreeable to go home and requests that Dr Merita Norton answer a few questions prior to her leaving. I have relayed this request to Dr Candiss Norse. She understands that she cannot stay past midnight. (Patient does not have Medicare appeal rights) She confirms that she has her own transportation home.

## 2019-10-16 NOTE — Progress Notes (Addendum)
Linda Mata to be D/C'd Home per MD order.  Discussed with the patient and all questions fully answered.  VSS, Skin clean, dry and intact without evidence of skin break down, no evidence of skin tears noted. IV catheter discontinued intact. Site without signs and symptoms of complications. Dressing and pressure applied.  An After Visit Summary was printed and given to the patient. Patient received prescription.  D/c education completed with patient/family including follow up instructions, medication list, d/c activities limitations if indicated, with other d/c instructions as indicated by MD - patient able to verbalize understanding, all questions fully answered.   Patient instructed to return to ED, call 911, or call MD for any changes in condition.   Patient escorted via [place], and D/C home via private auto.   patient states " I do not have a legal guardian" ,. Per patient she does not have a legal guardian.  D/C home with prescriptions for pharmacy.  Patient educated on eliquis, and coupon given, and also educated on location and time of Remdesivir infusion clinic.   [name] [facility] 10/16/2019 6:08 PM

## 2019-10-16 NOTE — Care Management (Signed)
Provided with 30 day and copay reduction coupons for Eliquis. Added [facility] remdesivir clinic to AVS. Will request CMA to schedule at [facility] for follow up

## 2019-10-16 NOTE — Progress Notes (Signed)
Patient requesting a new attending doctor. Patient states she does not feel comfortable being discharged home alone. Afraid about possibly passing out with coughing spell and no one there. Attempted to provide patient with reassurance and resources available. Made charge nurse aware. Will continue to monitor and make day RN aware.

## 2019-10-16 NOTE — Discharge Summary (Addendum)
Linda Mata BOM:[id number] DOB: Dec 18, 1960 DOA: 10/14/2019  PCP: Vernie Shanks, MD  Admit date: 10/14/2019  Discharge date: 10/16/2019  Admitted From: Home   Disposition:  Home   Recommendations for Outpatient Follow-up:   Follow up with PCP in 1-2 weeks  PCP Please obtain BMP/CBC, 2 view CXR in 1week,  (see Discharge instructions)   PCP Please follow up on the following pending results:    Home Health: None   Equipment/Devices: None  Consultations: None  Discharge Condition: Stable    CODE STATUS: Full    Diet Recommendation: Heart Healthy   Diet Order            Diet - low sodium heart healthy        Diet Heart Room service appropriate? Yes; Fluid consistency: Thin  Diet effective now               Chief Complaint  Patient presents with  . Covid+ / SOB /Fatigue     Brief history of present illness from the day of admission and additional interim summary    Linda Mata a 59 y.o.femalewith medical history significant ofanxiety, depression, GERD, hyperlipidemiapresenting to the ED with complaints of shortness of breath. She has documentation from [facility] from 10/06/2019 confirming a positive COVID-19 rapid antigen test and a negative influenza panel. Patient states her symptoms started about 2 to 3 days prior to the date of testing. She is having dyspnea on exertion, fevers, fatigue, and cough.                                                                  Hospital Course   1.  Acute Covid 19 Viral Pneumonitis during the ongoing 2020 Covid 19 Pandemic - she had mild disease he was started on IV remdesivir along with steroids, she transitioned to room air on 10/15/2019 and remained stable on room air, her pulse ox is between 96 and 97% on room air and even upon ambulation  she is not requiring any supplemental oxygen, her inflammatory markers have trended down, she will be discharged home with outpatient oral steroid taper and outpatient remdesivir infusions that she will finish once discharge.   Note patient is extremely anxious and going home, after seeing her this am, 2nd MD eval by another MD, was called again by the patient she had at least 16 questions for me written down for home discharge which were answered, she remains quite reluctant going home and thinks that she is getting dizzy when she stands up, orthostatics were checked and they were appropriately going up upon standing up.  She even ambulated without any assistance and dizziness.  She also threatened to sue the hospital if she was discharged however she was told that she will be discharged  only if she is clinically stable which she has proven to be after extensive clinical evaluation.  Patient also wanted me to give her a long-term note saying that she needs to work from home, this was politely declined as after 59 days from the initial day of diagnosis she has no indication to work extensively from home.    Addendum - after DC 5pm was called again, patient refused taking Prednisone which was prescribed, changed to PO Decadron.    Recent Labs  Lab 10/14/19 2354 10/15/19 0808 10/16/19 0326  CRP 9.8* 13.2* 9.5*  DDIMER 1.87* 1.93* 1.67*  FERRITIN 180  --   --   BNP  --  11.9 14.7  PROCALCITON <0.10  --   --     Hepatic Function Latest Ref Rng & Units 10/16/2019 10/14/2019 03/27/2014  Total Protein 6.5 - 8.1 g/dL 6.1(L) 7.2 7.6  Albumin 3.5 - 5.0 g/dL 2.5(L) 3.1(L) 3.8  AST 15 - 41 U/L '25 28 26  '$ ALT 0 - 44 U/L '28 23 30  '$ Alk Phosphatase 38 - 126 U/L 53 63 95  Total Bilirubin 0.3 - 1.2 mg/dL 0.2(L) 0.5 0.5  Bilirubin, Direct 0.0 - 0.3 mg/dL - - 0.2     2.  Nonspecific EKG changes.  No chest pain, negative HS Troponin.  Outpatient echocardiogram.   3.  Clear to D-dimer.  No swelling in  legs, no tachycardia or shortness of breath, this is due to inflammation from COVID-19 infection, she will get prophylactic dose Eliquis for 2 weeks upon discharge.  Case management to provide 1 month coupon.   Discharge diagnosis     Principal Problem:   Pneumonia due to COVID-19 virus Active Problems:   Acute respiratory failure with hypoxia (HCC)   Abnormal EKG    Discharge instructions    Discharge Instructions    Diet - low sodium heart healthy   Complete by: As directed    Discharge instructions   Complete by: As directed    Your beta hCG levels in the serum are slightly elevated, you need to get this rechecked with your primary care physician and see your OB physician within 7 to 10 days.  Follow with Primary MD Vernie Shanks, MD in 7 days   Get CBC, CMP, bHCG, 2 view Chest X ray -  checked next visit within 1 week by Primary MD    Activity: As tolerated with Full fall precautions use walker/cane & assistance as needed  Disposition Home    Diet: Heart Healthy   Special Instructions: If you have smoked or chewed Tobacco  in the last 2 yrs please stop smoking, stop any regular Alcohol  and or any Recreational drug use.  On your next visit with your primary care physician please Get Medicines reviewed and adjusted.  Please request your Prim.MD to go over all Hospital Tests and Procedure/Radiological results at the follow up, please get all Hospital records sent to your Prim MD by signing hospital release before you go home.  If you experience worsening of your admission symptoms, develop shortness of breath, life threatening emergency, suicidal or homicidal thoughts you must seek medical attention immediately by calling 911 or calling your MD immediately  if symptoms less severe.  You Must read complete instructions/literature along with all the possible adverse reactions/side effects for all the Medicines you take and that have been prescribed to you. Take any new  Medicines after you have completely understood and accpet all the possible adverse reactions/side effects.  Increase activity slowly   Complete by: As directed    MyChart COVID-19 home monitoring program   Complete by: Oct 16, 2019    Is the patient willing to use the [place] for home monitoring?: Yes   Temperature monitoring   Complete by: Oct 16, 2019    After how many days would you like to receive a notification of this patient's flowsheet entries?: 1      Discharge Medications   Allergies as of 10/16/2019      Reactions   Dexilant [dexlansoprazole] Other (See Comments)   Mental status changes   Nickel Rash      Medication List    STOP taking these medications   azithromycin 250 MG tablet Commonly known as: ZITHROMAX   predniSONE 5 MG tablet Commonly known as: DELTASONE     TAKE these medications   acetaminophen 500 MG tablet Commonly known as: TYLENOL Take 500-1,000 mg by mouth every 6 (six) hours as needed for fever.   Airborne [employer] 1 tablet by mouth every 6 (six) hours as needed (for).   albuterol 108 (90 Base) MCG/ACT inhaler Commonly known as: VENTOLIN HFA Inhale 2 puffs into the lungs every 6 (six) hours as needed for wheezing or shortness of breath.   apixaban 2.5 MG Tabs tablet Commonly known as: Eliquis Take 1 tablet (2.5 mg total) by mouth 2 (two) times daily.   aspirin 325 MG tablet Take 325 mg by mouth every 6 (six) hours as needed (for headaches or inflammation).   CALCIUM-MAGNESIUM-VITAMIN D PO Take 1 tablet by mouth daily.   dexamethasone 6 MG tablet Commonly known as: Decadron Take 1 tablet (6 mg total) by mouth daily for 5 days.   FIBER-CAPS PO Take 2-3 capsules by mouth daily.   milk thistle 175 MG tablet Take 175 mg by mouth daily.   Mucinex 600 MG 12 hr tablet Generic drug: guaiFENesin Take 600 mg by mouth daily.   PROBIOTIC DAILY PO Take 1 capsule by mouth daily.       Follow-up Information    Vernie Shanks, MD Follow up in 1 week(s).   Specialty: Family Medicine Why: Get beta-hCG levels checked next visit and also see your OB physician within a week, your hCG levels are slightly elevated in the blood. Contact information: [place] [address] [phone]        [facility] Follow up.   Why: Monday Tuesday and Wednesday at 11:30 Contact information:  Please advise them to report to [facility] at [address].           Major procedures and Radiology Reports - PLEASE review detailed and final reports thoroughly  -       DG Chest Portable 1 View  Result Date: 10/14/2019 CLINICAL DATA:  59 year old female positive COVID-19., weakness and shortness of breath. EXAM: PORTABLE CHEST 1 VIEW COMPARISON:  Chest radiographs 08/15/2016 and earlier. FINDINGS: Portable AP upright view at 2247 hours. Lower lung volumes. Mediastinal contours remain normal. Visualized tracheal air column is within normal limits. There is patchy and indistinct peripheral opacity in both lungs, greater on the right. No superimposed pneumothorax or pleural effusion. Negative visible bowel gas pattern. No osseous abnormality identified. IMPRESSION: Patchy and peripheral bilateral pulmonary opacity typical of COVID-19 pneumonia. No pleural effusion. Electronically Signed   By: Genevie Ann M.D.   On: 10/14/2019 23:00    Micro Results     Recent  Results (from the past 240 hour(s))  Blood Culture (routine x 2)     Status: None (Preliminary result)   Collection Time: 10/15/19 12:09 AM   Specimen: BLOOD  Result Value Ref Range Status   Specimen Description BLOOD RIGHT ARM  Final   Special Requests   Final    BOTTLES DRAWN AEROBIC AND ANAEROBIC Blood Culture adequate volume   Culture   Final    NO GROWTH 1 DAY Performed at [facility], [address], [place], [place] [address]    Report Status PENDING  Incomplete  Blood Culture (routine x 2)     Status:  None (Preliminary result)   Collection Time: 10/15/19 12:10 AM   Specimen: BLOOD  Result Value Ref Range Status   Specimen Description BLOOD LEFT ARM  Final   Special Requests   Final    BOTTLES DRAWN AEROBIC AND ANAEROBIC Blood Culture results may not be optimal due to an excessive volume of blood received in culture bottles   Culture   Final    NO GROWTH 1 DAY Performed at [facility], [place] [address] [place], [place] [address]    Report Status PENDING  Incomplete    Today   Subjective    Linda Mata today has no headache,no chest abdominal pain,no new weakness tingling or numbness, feels much better wants to go home today.    Objective   Blood pressure 111/70, pulse 83, temperature 98.4 F (36.9 C), temperature source Oral, resp. rate 19, height '5\' 6"'$  (1.676 m), weight 120 kg, SpO2 99 %.  No intake or output data in the 24 hours ending 10/16/19 1711  Exam  Awake Alert,  No new F.N deficits, Normal affect [place].AT,PERRAL Supple Neck,No JVD, No cervical lymphadenopathy appriciated.  Symmetrical Chest wall movement, Good air movement bilaterally, CTAB RRR,No Gallops,Rubs or new Murmurs, No Parasternal Heave +ve B.Sounds, Abd Soft, Non tender, No organomegaly appriciated, No rebound -guarding or rigidity. No Cyanosis, Clubbing or edema, No new Rash or bruise   Data Review   CBC w Diff:  Lab Results  Component Value Date   WBC 11.8 (H) 10/16/2019   HGB 13.2 10/16/2019   HCT 40.7 10/16/2019   PLT 426 (H) 10/16/2019   LYMPHOPCT 19 10/16/2019   MONOPCT 9 10/16/2019   EOSPCT 1 10/16/2019   BASOPCT 0 10/16/2019    CMP:  Lab Results  Component Value Date   NA 140 10/16/2019   K 4.1 10/16/2019   CL 107 10/16/2019   CO2 23 10/16/2019   BUN 21 (H) 10/16/2019   CREATININE 0.74 10/16/2019   PROT 6.1 (L) 10/16/2019   ALBUMIN 2.5 (L) 10/16/2019   BILITOT 0.2 (L) 10/16/2019   ALKPHOS 53 10/16/2019   AST 25 10/16/2019   ALT 28 10/16/2019  .   Total Time in  preparing paper work, data evaluation and todays exam - 26 minutes  Lala Lund M.D on 10/16/2019 at 5:11 PM  [facility]   Office  [phone]

## 2019-10-16 NOTE — Evaluation (Signed)
Physical Therapy Evaluation Patient Details Name: Linda Mata MRN: [id number] DOB: 20-Feb-1961 Today's Date: 10/16/2019   History of Present Illness  Linda Mata is a 59 y.o. female with medical history significant of anxiety, depression, GERD, hyperlipidemia presenting to the ED with complaints of shortness of breath, fevers, fatigue, and cough.  She has documentation from [facility] from 10/06/2019 confirming a positive COVID-19 rapid antigen test and a negative influenza panel. She was started on IV Remdesivir along with steroids.   Clinical Impression  Patient evaluated by Physical Therapy with no further acute PT needs identified. Prior to admission, pt lives alone, works as a Pharmacist, hospital, and enjoys going to [employer]. Pt ambulating 1,500 feet (community distances) without physical assist or dyspnea. SpO2 > 90% on RA. Education provided regarding exercise program with specific recommendations provided for progressive cardio and resistance training exercises. All education has been completed and the patient has no further questions. No follow-up Physical Therapy or equipment needs. PT is signing off. Thank you for this referral.     Follow Up Recommendations No PT follow up    Equipment Recommendations  None recommended by PT    Recommendations for Other Services       Precautions / Restrictions Precautions Precautions: None Restrictions Weight Bearing Restrictions: No      Mobility  Bed Mobility               General bed mobility comments: OOB in chair  Transfers Overall transfer level: Independent Equipment used: None                Ambulation/Gait Ambulation/Gait assistance: Independent Gait Distance (Feet): 1500 Feet Assistive device: None Gait Pattern/deviations: WFL(Within Functional Limits)        Stairs            Wheelchair Mobility    Modified Rankin (Stroke Patients Only)       Balance Overall balance assessment: No  apparent balance deficits (not formally assessed)                                           Pertinent Vitals/Pain Pain Assessment: No/denies pain    Home Living Family/patient expects to be discharged to:: Private residence Living Arrangements: Alone Available Help at Discharge: Friend(s);Available PRN/intermittently Type of Home: House Home Access: Level entry     Home Layout: One level        Prior Function Level of Independence: Independent         Comments: Works as a Statistician        Extremity/Trunk Assessment   Upper Extremity Assessment Upper Extremity Assessment: Overall WFL for tasks assessed    Lower Extremity Assessment Lower Extremity Assessment: Overall WFL for tasks assessed    Cervical / Trunk Assessment Cervical / Trunk Assessment: Normal  Communication   Communication: No difficulties  Cognition Arousal/Alertness: Awake/alert Behavior During Therapy: Anxious Overall Cognitive Status: Within Functional Limits for tasks assessed                                        General Comments      Exercises     Assessment/Plan    PT Assessment Patent does not need any further PT services  PT Problem List  PT Treatment Interventions      PT Goals (Current goals can be found in the Care Plan section)  Acute Rehab PT Goals Patient Stated Goal: return to normal PT Goal Formulation: All assessment and education complete, DC therapy    Frequency     Barriers to discharge        Co-evaluation               AM-PAC PT "6 Clicks" Mobility  Outcome Measure Help needed turning from your back to your side while in a flat bed without using bedrails?: None Help needed moving from lying on your back to sitting on the side of a flat bed without using bedrails?: None Help needed moving to and from a bed to a chair (including a wheelchair)?: None Help needed standing up from a chair  using your arms (e.g., wheelchair or bedside chair)?: None Help needed to walk in hospital room?: None Help needed climbing 3-5 steps with a railing? : None 6 Click Score: 24    End of Session   Activity Tolerance: Patient tolerated treatment well Patient left: in chair;with call bell/phone within reach Nurse Communication: Mobility status PT Visit Diagnosis: Difficulty in walking, not elsewhere classified (R26.2)    Time: [id number] PT Time Calculation (min) (ACUTE ONLY): 29 min   Charges:   PT Evaluation $PT Eval Low Complexity: 1 Low PT Treatments $Therapeutic Activity: 8-22 mins          Wyona Almas, PT, DPT Acute Rehabilitation Services Pager [phone] Office [phone]   Deno Etienne 10/16/2019, 2:07 PM

## 2019-10-17 ENCOUNTER — Ambulatory Visit (HOSPITAL_COMMUNITY)
Admission: RE | Admit: 2019-10-17 | Discharge: 2019-10-17 | Disposition: A | Discharge status: Home / Self Care | Payer: BC Managed Care – PPO | Source: Ambulatory Visit | Attending: Pulmonary Disease | Admitting: Pulmonary Disease

## 2019-10-17 DIAGNOSIS — U071 COVID-19: Secondary | ICD-10-CM | POA: Insufficient documentation

## 2019-10-17 MED ORDER — METHYLPREDNISOLONE SODIUM SUCC 125 MG IJ SOLR: 125.0000 mg | Freq: Once | INTRAMUSCULAR | Status: DC | PRN

## 2019-10-17 MED ORDER — DIPHENHYDRAMINE HCL 50 MG/ML IJ SOLN: 50.0000 mg | Freq: Once | INTRAMUSCULAR | Status: DC | PRN

## 2019-10-17 MED ORDER — ALBUTEROL SULFATE HFA 108 (90 BASE) MCG/ACT IN AERS: 2.0000 | INHALATION_SPRAY | Freq: Once | RESPIRATORY_TRACT | Status: DC | PRN

## 2019-10-17 MED ORDER — SODIUM CHLORIDE 0.9 % IV SOLN: INTRAVENOUS | Status: DC | PRN

## 2019-10-17 MED ORDER — EPINEPHRINE 0.3 MG/0.3ML IJ SOAJ: 0.3000 mg | Freq: Once | INTRAMUSCULAR | Status: DC | PRN

## 2019-10-17 MED ORDER — SODIUM CHLORIDE 0.9 % IV SOLN
100.0000 mg | Freq: Once | INTRAVENOUS | Status: AC
  Administered 2019-10-17: 100 mg via INTRAVENOUS
  Filled 2019-10-17: qty 20

## 2019-10-17 MED ORDER — FAMOTIDINE IN NACL 20-0.9 MG/50ML-% IV SOLN: 20.0000 mg | Freq: Once | INTRAVENOUS | Status: DC | PRN

## 2019-10-17 NOTE — Progress Notes (Signed)
  Diagnosis: COVID-19  Physician: Dr. Wright  Procedure: [facility] Med: remdesivir infusion - Provided patient with remdesivir fact sheet for patients, parents and caregivers prior to infusion.  Complications: No immediate complications noted.  Discharge: Discharged home   Ardyth Kelso 10/17/2019   

## 2019-10-18 ENCOUNTER — Ambulatory Visit (HOSPITAL_COMMUNITY)
Admit: 2019-10-18 | Discharge: 2019-10-18 | Disposition: A | Discharge status: Home / Self Care | Payer: BC Managed Care – PPO | Attending: Pulmonary Disease | Admitting: Pulmonary Disease

## 2019-10-18 DIAGNOSIS — U071 COVID-19: Secondary | ICD-10-CM | POA: Diagnosis not present

## 2019-10-18 MED ORDER — ALBUTEROL SULFATE HFA 108 (90 BASE) MCG/ACT IN AERS: 2.0000 | INHALATION_SPRAY | Freq: Once | RESPIRATORY_TRACT | Status: DC | PRN

## 2019-10-18 MED ORDER — EPINEPHRINE 0.3 MG/0.3ML IJ SOAJ: 0.3000 mg | Freq: Once | INTRAMUSCULAR | Status: DC | PRN

## 2019-10-18 MED ORDER — SODIUM CHLORIDE 0.9 % IV SOLN: INTRAVENOUS | Status: DC | PRN

## 2019-10-18 MED ORDER — DIPHENHYDRAMINE HCL 50 MG/ML IJ SOLN: 50.0000 mg | Freq: Once | INTRAMUSCULAR | Status: DC | PRN

## 2019-10-18 MED ORDER — METHYLPREDNISOLONE SODIUM SUCC 125 MG IJ SOLR: 125.0000 mg | Freq: Once | INTRAMUSCULAR | Status: DC | PRN

## 2019-10-18 MED ORDER — SODIUM CHLORIDE 0.9 % IV SOLN
100.0000 mg | Freq: Once | INTRAVENOUS | Status: AC
  Administered 2019-10-18: 12:00:00 100 mg via INTRAVENOUS

## 2019-10-18 MED ORDER — FAMOTIDINE IN NACL 20-0.9 MG/50ML-% IV SOLN: 20.0000 mg | Freq: Once | INTRAVENOUS | Status: DC | PRN

## 2019-10-18 NOTE — Progress Notes (Addendum)
  Diagnosis: COVID-19  Physician:Dr Joya Gaskins  Procedure: [facility] Med: remdesivir infusion - Provided patient with remdesivir fact sheet for patients, parents and caregivers prior to infusion.  Complications: No immediate complications noted.  Discharge: Discharged home   [place], [place] 10/18/2019

## 2019-10-19 ENCOUNTER — Ambulatory Visit (HOSPITAL_COMMUNITY)
Admit: 2019-10-19 | Discharge: 2019-10-19 | Disposition: A | Discharge status: Home / Self Care | Payer: BC Managed Care – PPO | Attending: Pulmonary Disease | Admitting: Pulmonary Disease

## 2019-10-19 DIAGNOSIS — U071 COVID-19: Secondary | ICD-10-CM | POA: Diagnosis not present

## 2019-10-19 MED ORDER — EPINEPHRINE 0.3 MG/0.3ML IJ SOAJ: 0.3000 mg | Freq: Once | INTRAMUSCULAR | Status: DC | PRN

## 2019-10-19 MED ORDER — DIPHENHYDRAMINE HCL 50 MG/ML IJ SOLN: 50.0000 mg | Freq: Once | INTRAMUSCULAR | Status: DC | PRN

## 2019-10-19 MED ORDER — SODIUM CHLORIDE 0.9 % IV SOLN
100.0000 mg | Freq: Once | INTRAVENOUS | Status: AC
  Administered 2019-10-19: 100 mg via INTRAVENOUS
  Filled 2019-10-19: qty 20

## 2019-10-19 MED ORDER — SODIUM CHLORIDE 0.9 % IV SOLN: INTRAVENOUS | Status: DC | PRN

## 2019-10-19 MED ORDER — METHYLPREDNISOLONE SODIUM SUCC 125 MG IJ SOLR: 125.0000 mg | Freq: Once | INTRAMUSCULAR | Status: DC | PRN

## 2019-10-19 MED ORDER — ALBUTEROL SULFATE HFA 108 (90 BASE) MCG/ACT IN AERS: 2.0000 | INHALATION_SPRAY | Freq: Once | RESPIRATORY_TRACT | Status: DC | PRN

## 2019-10-19 MED ORDER — FAMOTIDINE IN NACL 20-0.9 MG/50ML-% IV SOLN: 20.0000 mg | Freq: Once | INTRAVENOUS | Status: DC | PRN

## 2019-10-19 NOTE — Progress Notes (Signed)
  Diagnosis: COVID-19  Physician: Dr. Wright  Procedure: [facility] Med: remdesivir infusion - Provided patient with remdesivir fact sheet for patients, parents and caregivers prior to infusion.  Complications: No immediate complications noted.  Discharge: Discharged home   Rene Sizelove L 10/19/2019   

## 2019-10-20 LAB — CULTURE, BLOOD (ROUTINE X 2)
Culture: NO GROWTH
Culture: NO GROWTH
Special Requests: ADEQUATE

## 2019-11-02 ENCOUNTER — Other Ambulatory Visit: Payer: Self-pay | Admitting: Family Medicine

## 2019-11-02 DIAGNOSIS — R899 Unspecified abnormal finding in specimens from other organs, systems and tissues: Secondary | ICD-10-CM

## 2019-11-03 ENCOUNTER — Other Ambulatory Visit: Payer: Self-pay | Admitting: Family Medicine

## 2019-11-03 DIAGNOSIS — R899 Unspecified abnormal finding in specimens from other organs, systems and tissues: Secondary | ICD-10-CM

## 2019-11-07 ENCOUNTER — Other Ambulatory Visit: Payer: BC Managed Care – PPO

## 2019-11-08 ENCOUNTER — Other Ambulatory Visit: Payer: Self-pay

## 2019-11-08 ENCOUNTER — Ambulatory Visit
Admission: RE | Admit: 2019-11-08 | Discharge: 2019-11-08 | Disposition: A | Discharge status: Home / Self Care | Payer: BC Managed Care – PPO | Source: Ambulatory Visit | Attending: Family Medicine | Admitting: Family Medicine

## 2019-11-08 DIAGNOSIS — R899 Unspecified abnormal finding in specimens from other organs, systems and tissues: Secondary | ICD-10-CM

## 2019-11-09 ENCOUNTER — Ambulatory Visit: Payer: BC Managed Care – PPO

## 2019-11-23 ENCOUNTER — Encounter: Payer: Self-pay | Admitting: Pulmonary Disease

## 2020-01-04 ENCOUNTER — Encounter: Payer: Self-pay | Admitting: Pulmonary Disease

## 2020-01-04 ENCOUNTER — Ambulatory Visit: Payer: BC Managed Care – PPO | Admitting: Pulmonary Disease

## 2020-01-04 ENCOUNTER — Other Ambulatory Visit: Payer: Self-pay

## 2020-01-04 VITALS — BP 128/68 | HR 91 | Temp 98.3°F | Ht 66.0 in | Wt 241.8 lb

## 2020-01-04 DIAGNOSIS — U071 COVID-19: Secondary | ICD-10-CM | POA: Diagnosis not present

## 2020-01-04 DIAGNOSIS — J1282 Pneumonia due to coronavirus disease 2019: Secondary | ICD-10-CM | POA: Diagnosis not present

## 2020-01-04 DIAGNOSIS — R0602 Shortness of breath: Secondary | ICD-10-CM

## 2020-01-04 NOTE — Patient Instructions (Signed)
We'll schedule high-resolution CT and pulmonary function test for better evaluation of your lungs for interstitial lung disease after COVID-19  Follow-up in clinic after the studies for review
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
## 2020-01-10 ENCOUNTER — Telehealth: Payer: Self-pay | Admitting: Pulmonary Disease

## 2020-01-10 NOTE — Telephone Encounter (Signed)
Left detailed message for the patient it was the ct dept calling her

## 2020-01-11 ENCOUNTER — Telehealth: Payer: Self-pay | Admitting: Pulmonary Disease

## 2020-01-11 NOTE — Telephone Encounter (Signed)
I called the patient she stated ct dept had already called her

## 2020-01-20 ENCOUNTER — Ambulatory Visit (INDEPENDENT_AMBULATORY_CARE_PROVIDER_SITE_OTHER)
Admission: RE | Admit: 2020-01-20 | Discharge: 2020-01-20 | Disposition: A | Discharge status: Home / Self Care | Payer: BC Managed Care – PPO | Source: Ambulatory Visit | Attending: Pulmonary Disease | Admitting: Pulmonary Disease

## 2020-01-20 ENCOUNTER — Other Ambulatory Visit: Payer: Self-pay

## 2020-01-20 DIAGNOSIS — U071 COVID-19: Secondary | ICD-10-CM

## 2020-01-20 DIAGNOSIS — J1282 Pneumonia due to coronavirus disease 2019: Secondary | ICD-10-CM | POA: Diagnosis not present

## 2020-01-20 DIAGNOSIS — R0602 Shortness of breath: Secondary | ICD-10-CM

## 2020-02-06 ENCOUNTER — Other Ambulatory Visit: Payer: Self-pay

## 2020-02-06 DIAGNOSIS — U071 COVID-19: Secondary | ICD-10-CM

## 2020-02-06 MED ORDER — AZITHROMYCIN 250 MG PO TABS: ORAL_TABLET | ORAL | 0 refills | Status: DC

## 2020-02-06 NOTE — Progress Notes (Signed)
Spoke with patient and gave her the recommendations that Dr Vaughan Browner stated about getting the COVID vaccination. Patient expressed understanding. Nothing further need at this time.

## 2020-02-06 NOTE — Progress Notes (Signed)
Called and spoke with patient to go ahead and start the Z-pak per Dr Vaughan Browner. Order placed.

## 2020-02-06 NOTE — Progress Notes (Signed)
Discussed ordering CT in 6 weeks with patient. Orders for CT without contrast put in.

## 2020-03-06 ENCOUNTER — Ambulatory Visit (INDEPENDENT_AMBULATORY_CARE_PROVIDER_SITE_OTHER): Payer: BC Managed Care – PPO | Admitting: Pulmonary Disease

## 2020-03-06 ENCOUNTER — Encounter: Payer: Self-pay | Admitting: *Deleted

## 2020-03-06 ENCOUNTER — Ambulatory Visit: Payer: BC Managed Care – PPO | Admitting: Pulmonary Disease

## 2020-03-06 ENCOUNTER — Other Ambulatory Visit: Payer: Self-pay

## 2020-03-06 ENCOUNTER — Encounter: Payer: Self-pay | Admitting: Pulmonary Disease

## 2020-03-06 VITALS — BP 106/76 | HR 77 | Temp 98.4°F | Ht 66.0 in | Wt 245.6 lb

## 2020-03-06 DIAGNOSIS — U071 COVID-19: Secondary | ICD-10-CM

## 2020-03-06 DIAGNOSIS — R0602 Shortness of breath: Secondary | ICD-10-CM

## 2020-03-06 DIAGNOSIS — J1282 Pneumonia due to coronavirus disease 2019: Secondary | ICD-10-CM

## 2020-03-06 LAB — PULMONARY FUNCTION TEST
DL/VA % pred: 95 %
DL/VA: 4 ml/min/mmHg/L
DLCO cor % pred: 97 %
DLCO cor: 20.57 ml/min/mmHg
DLCO unc % pred: 97 %
DLCO unc: 20.57 ml/min/mmHg
FEF 25-75 Post: 3.16 L/sec
FEF 25-75 Pre: 2.42 L/sec
FEF2575-%Change-Post: 30 %
FEF2575-%Pred-Post: 126 %
FEF2575-%Pred-Pre: 97 %
FEV1-%Change-Post: 6 %
FEV1-%Pred-Post: 103 %
FEV1-%Pred-Pre: 97 %
FEV1-Post: 2.81 L
FEV1-Pre: 2.64 L
FEV1FVC-%Change-Post: 0 %
FEV1FVC-%Pred-Pre: 99 %
FEV6-%Change-Post: 4 %
FEV6-%Pred-Post: 104 %
FEV6-%Pred-Pre: 99 %
FEV6-Post: 3.53 L
FEV6-Pre: 3.36 L
FEV6FVC-%Change-Post: -1 %
FEV6FVC-%Pred-Post: 101 %
FEV6FVC-%Pred-Pre: 103 %
FVC-%Change-Post: 6 %
FVC-%Pred-Post: 102 %
FVC-%Pred-Pre: 96 %
FVC-Post: 3.58 L
FVC-Pre: 3.37 L
Post FEV1/FVC ratio: 78 %
Post FEV6/FVC ratio: 98 %
Pre FEV1/FVC ratio: 78 %
Pre FEV6/FVC Ratio: 100 %
RV % pred: 110 %
RV: 2.2 L
TLC % pred: 110 %
TLC: 5.75 L

## 2020-03-06 NOTE — Progress Notes (Signed)
PFT done today. 
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
## 2020-03-06 NOTE — Patient Instructions (Addendum)
I have reviewed your CT chest which shows mild changes due to Covid There is also a focal area of infection that we had already treated with antibiotics You have a follow-up CT scan already ordered  Your lung function tests are normal Continue to work on weight loss with diet and exercise and intermittent fasting  Follow-up in 6 months.

## 2020-03-19 ENCOUNTER — Ambulatory Visit
Admission: RE | Admit: 2020-03-19 | Discharge: 2020-03-19 | Disposition: A | Discharge status: Home / Self Care | Payer: BC Managed Care – PPO | Source: Ambulatory Visit | Attending: Pulmonary Disease | Admitting: Pulmonary Disease

## 2020-03-19 ENCOUNTER — Other Ambulatory Visit: Payer: Self-pay

## 2020-03-19 ENCOUNTER — Inpatient Hospital Stay: Admission: RE | Admit: 2020-03-19 | Payer: BC Managed Care – PPO | Source: Ambulatory Visit

## 2020-03-19 DIAGNOSIS — J1282 Pneumonia due to coronavirus disease 2019: Secondary | ICD-10-CM

## 2020-03-19 DIAGNOSIS — R0602 Shortness of breath: Secondary | ICD-10-CM

## 2020-03-19 DIAGNOSIS — U071 COVID-19: Secondary | ICD-10-CM

## 2020-03-26 NOTE — Addendum Note (Signed)
Addended by: Vanessa Barbara on: 03/26/2020 12:17 PM   Modules accepted: Orders

## 2020-03-26 NOTE — Progress Notes (Signed)
Called and spoke with patient, advised of results of CT scan and recommended f/u in 6 months.  She verbalized understanding.  Nothing further needed.

## 2020-09-28 ENCOUNTER — Other Ambulatory Visit: Payer: Self-pay

## 2020-09-28 ENCOUNTER — Ambulatory Visit
Admission: RE | Admit: 2020-09-28 | Discharge: 2020-09-28 | Disposition: A | Discharge status: Home / Self Care | Payer: BC Managed Care – PPO | Source: Ambulatory Visit | Attending: Pulmonary Disease | Admitting: Pulmonary Disease

## 2020-09-28 DIAGNOSIS — R0602 Shortness of breath: Secondary | ICD-10-CM

## 2020-09-28 DIAGNOSIS — J1282 Pneumonia due to coronavirus disease 2019: Secondary | ICD-10-CM

## 2020-09-28 DIAGNOSIS — U071 COVID-19: Secondary | ICD-10-CM

## 2020-10-04 ENCOUNTER — Other Ambulatory Visit: Payer: Self-pay | Admitting: *Deleted

## 2020-10-04 DIAGNOSIS — R911 Solitary pulmonary nodule: Secondary | ICD-10-CM

## 2020-10-08 ENCOUNTER — Other Ambulatory Visit (INDEPENDENT_AMBULATORY_CARE_PROVIDER_SITE_OTHER): Payer: Self-pay

## 2020-10-30 ENCOUNTER — Ambulatory Visit (INDEPENDENT_AMBULATORY_CARE_PROVIDER_SITE_OTHER): Payer: BC Managed Care – PPO | Admitting: Internal Medicine

## 2020-10-30 ENCOUNTER — Encounter (INDEPENDENT_AMBULATORY_CARE_PROVIDER_SITE_OTHER): Payer: Self-pay | Admitting: Internal Medicine

## 2020-10-30 ENCOUNTER — Other Ambulatory Visit: Payer: Self-pay

## 2020-10-30 VITALS — BP 142/80 | HR 77 | Temp 97.2°F | Resp 18 | Ht 64.0 in | Wt 241.2 lb

## 2020-10-30 DIAGNOSIS — R5381 Other malaise: Secondary | ICD-10-CM | POA: Diagnosis not present

## 2020-10-30 DIAGNOSIS — E2839 Other primary ovarian failure: Secondary | ICD-10-CM

## 2020-10-30 DIAGNOSIS — R5383 Other fatigue: Secondary | ICD-10-CM

## 2020-10-30 DIAGNOSIS — E559 Vitamin D deficiency, unspecified: Secondary | ICD-10-CM

## 2020-10-30 DIAGNOSIS — R7303 Prediabetes: Secondary | ICD-10-CM | POA: Diagnosis not present

## 2020-10-30 NOTE — Progress Notes (Signed)
Metrics: Intervention Frequency ACO  Documented Smoking Status Yearly  Screened one or more times in 24 months  Cessation Counseling or  Active cessation medication Past 24 months  Past 24 months   Guideline developer: UpToDate (See UpToDate for funding source) Date Released: 2014       Wellness Office Visit  Subjective:  Patient ID: Linda Mata, female    DOB: 1961-04-09  Age: 60 y.o. MRN: [id number]  CC: This 60 year old [place] lady comes to our practice as a new patient by the recommendation of her OB/GYN, Dr. Runell Gess. HPI She would like to have more energy, lose weight, improve her overall health. She admits to decreased libido over the years.  She has been menopausal for more than a decade.  Past Medical History:  Diagnosis Date  . Allergy    metal  . Anxiety   . Asthma    childhood, resolution as teen  . Colon polyp    adenomatous  . Depression   . GERD (gastroesophageal reflux disease)   . Headache(784.0)   . Hyperlipidemia   . Migraine    Past Surgical History:  Procedure Laterality Date  . BREAST BIOPSY  1987    [place], left  . CHOLECYSTECTOMY N/A 02/05/2018   Procedure: LAPAROSCOPIC CHOLECYSTECTOMY WITH INTRAOPERATIVE CHOLANGIOGRAM;  Surgeon: Armandina Gemma, MD;  Location: [facility];  Service: General;  Laterality: N/A;  . ESI  2010(X2),2011,2012   Dr Nelva Bush  . LAPAROSCOPY    . TONSILLECTOMY AND ADENOIDECTOMY    . UTERINE FIBROID SURGERY  2002     Family History  Problem Relation Age of Onset  . Hyperlipidemia Mother   . Hyperlipidemia Father   . Stroke Father   . Hypertension Father   . Breast cancer Maternal Grandmother   . Breast cancer Maternal Aunt   . Diabetes Neg Hx     Social History   Social History Narrative   Divorced since 2012,married for 9 years.Lives alone.From [place],in [place] since [place].High school teacher-English.   Social History   Tobacco Use  . Smoking status: Former Research scientist (life sciences)  . Smokeless tobacco: Never Used  .  Tobacco comment: smoked [place], up < 1 ppd  Substance Use Topics  . Alcohol use: No    Current Meds  Medication Sig  . albuterol (VENTOLIN HFA) 108 (90 Base) MCG/ACT inhaler Inhale 2 puffs into the lungs every 6 (six) hours as needed for wheezing or shortness of breath.  Marland Kitchen aspirin 325 MG tablet Take 325 mg by mouth every 6 (six) hours as needed (for headaches or inflammation).   . milk thistle 175 MG tablet Take 175 mg by mouth daily.  . Probiotic Product (PROBIOTIC DAILY PO) Take 1 capsule by mouth daily.       Objective:   Today's Vitals: BP (!) 142/80 (BP Location: Right Arm, Patient Position: Sitting, Cuff Size: Normal)   Pulse 77   Temp (!) 97.2 F (36.2 C) (Temporal)   Resp 18   Ht 5\' 4"  (1.626 m)   Wt 241 lb 3.2 oz (109.4 kg)   SpO2 98%   BMI 41.40 kg/m  Vitals with BMI 10/30/2020 03/06/2020 01/04/2020  Height 5\' 4"  5\' 6"  5\' 6"   Weight 241 lbs 3 oz 245 lbs 10 oz 241 lbs 13 oz  BMI 41.38 70.26 37.85  Systolic 885 027 741  Diastolic 80 76 68  Pulse 77 77 91     Physical Exam   She is morbidly obese.  Blood pressure somewhat elevated today but  previously has been in a good range.    Assessment   1. Prediabetes   2. Malaise and fatigue   3. Vitamin D deficiency disease   4. Morbid obesity ([place])   5. Primary ovarian failure       Tests ordered Orders Placed This Encounter  Procedures  . DHEA-sulfate  . Estradiol  . Progesterone  . T3, free  . T4, free  . TSH  . Testos,Total,Free and SHBG (Female)     Plan: 1. Blood work is ordered. 2. I will see her in the next few weeks to discuss all these results and further recommendations regarding nutrition, exercise and hormones. 3. I spent 45 minutes with this patient discussing her symptoms and making a plan of action.   No orders of the defined types were placed in this encounter.   Doree Albee, MD

## 2020-11-02 LAB — ESTRADIOL: Estradiol: 18 pg/mL

## 2020-11-02 LAB — TSH: TSH: 2.02 mIU/L (ref 0.40–4.50)

## 2020-11-02 LAB — TESTOS,TOTAL,FREE AND SHBG (FEMALE)
Free Testosterone: 1.9 pg/mL (ref 0.1–6.4)
Sex Hormone Binding: 45 nmol/L (ref 14–73)
Testosterone, Total, LC-MS-MS: 20 ng/dL (ref 2–45)

## 2020-11-02 LAB — PROGESTERONE: Progesterone: <0.5 ng/mL

## 2020-11-02 LAB — T3, FREE: T3, Free: 3 pg/mL (ref 2.3–4.2)

## 2020-11-02 LAB — DHEA-SULFATE: DHEA-SO4: 49 ug/dL (ref 5–167)

## 2020-11-02 LAB — T4, FREE: Free T4: 1 ng/dL (ref 0.8–1.8)

## 2020-11-26 ENCOUNTER — Other Ambulatory Visit: Payer: Self-pay

## 2020-11-26 ENCOUNTER — Ambulatory Visit (INDEPENDENT_AMBULATORY_CARE_PROVIDER_SITE_OTHER): Payer: BC Managed Care – PPO | Admitting: Internal Medicine

## 2020-11-26 ENCOUNTER — Encounter (INDEPENDENT_AMBULATORY_CARE_PROVIDER_SITE_OTHER): Payer: Self-pay | Admitting: Internal Medicine

## 2020-11-26 ENCOUNTER — Other Ambulatory Visit (INDEPENDENT_AMBULATORY_CARE_PROVIDER_SITE_OTHER): Payer: Self-pay | Admitting: Internal Medicine

## 2020-11-26 VITALS — BP 130/80 | HR 88 | Temp 97.3°F | Resp 18 | Ht 65.0 in | Wt 241.0 lb

## 2020-11-26 DIAGNOSIS — E2839 Other primary ovarian failure: Secondary | ICD-10-CM

## 2020-11-26 DIAGNOSIS — R1900 Intra-abdominal and pelvic swelling, mass and lump, unspecified site: Secondary | ICD-10-CM | POA: Diagnosis not present

## 2020-11-26 NOTE — Progress Notes (Signed)
Feels very tired after work. No mood to exercise no desire.

## 2020-11-26 NOTE — Patient Instructions (Signed)
[facility] Dietary Recommendations for Weight Loss What to Avoid . Avoid added sugars o Often added sugar can be found in processed foods such as many condiments, dry cereals, cakes, cookies, chips, crisps, crackers, candies, sweetened drinks, etc.  o Read labels and AVOID/DECREASE use of foods with the following in their ingredient list: Sugar, fructose, high fructose corn syrup, sucrose, glucose, maltose, dextrose, molasses, cane sugar, brown sugar, any type of syrup, agave nectar, etc.   . Avoid snacking in between meals . Avoid foods made with flour o If you are going to eat food made with flour, choose those made with whole-grains; and, minimize your consumption as much as is tolerable . Avoid processed foods o These foods are generally stocked in the middle of the grocery store. Focus on shopping on the perimeter of the grocery.  . Avoid Meat  o We recommend following a plant-based diet at [facility]. Thus, we recommend avoiding meat as a general rule. Consider eating beans, legumes, eggs, and/or dairy products for regular protein sources o If you plan on eating meat limit to 4 ounces of meat at a time and choose lean options such as Fish, chicken, turkey. Avoid red meat intake such as pork and/or steak What to Include . Vegetables o GREEN LEAFY VEGETABLES: Kale, spinach, mustard greens, collard greens, cabbage, broccoli, etc. o OTHER: Asparagus, cauliflower, eggplant, carrots, peas, Brussel sprouts, tomatoes, bell peppers, zucchini, beets, cucumbers, etc. . Grains, seeds, and legumes o Beans: kidney beans, black eyed peas, garbanzo beans, black beans, pinto beans, etc. o Whole, unrefined grains: brown rice, barley, bulgur, oatmeal, etc. . Healthy fats  o Avoid highly processed fats such as vegetable oil o Examples of healthy fats: avocado, olives, virgin olive oil, dark chocolate (?72% Cocoa), nuts (peanuts, almonds, walnuts, cashews, pecans, etc.) . None to Low  Intake of Animal Sources of Protein o Meat sources: chicken, turkey, salmon, tuna. Limit to 4 ounces of meat at one time. o Consider limiting dairy sources, but when choosing dairy focus on: PLAIN Greek yogurt, cottage cheese, high-protein milk . Fruit o Choose berries  When to Eat . Intermittent Fasting: o Choosing not to eat for a specific time period, but DO FOCUS ON HYDRATION when fasting o Multiple Techniques: - Time Restricted Eating: eat 3 meals in a day, each meal lasting no more than 60 minutes, no snacks between meals - 16-18 hour fast: fast for 16 to 18 hours up to 7 days a week. Often suggested to start with 2-3 nonconsecutive days per week.  . Remember the time you sleep is counted as fasting.  . Examples of eating schedule: Fast from 7:00pm-11:00am. Eat between 11:00am-7:00pm.  - 24-hour fast: fast for 24 hours up to every other day. Often suggested to start with 1 day per week . Remember the time you sleep is counted as fasting . Examples of eating schedule:  o Eating day: eat 2-3 meals on your eating day. If doing 2 meals, each meal should last no more than 90 minutes. If doing 3 meals, each meal should last no more than 60 minutes. Finish last meal by 7:00pm. o Fasting day: Fast until 7:00pm.  o IF YOU FEEL UNWELL FOR ANY REASON/IN ANY WAY WHEN FASTING, STOP FASTING BY EATING A NUTRITIOUS SNACK OR LIGHT MEAL o ALWAYS FOCUS ON HYDRATION DURING FASTS - Acceptable Hydration sources: water, broths, tea/coffee (black tea/coffee is best but using a small amount of whole-fat dairy products in coffee/tea is acceptable).  -   Poor Hydration Sources: anything with sugar or artificial sweeteners added to it  These recommendations have been developed for patients that are actively receiving medical care from either Dr. Drianna Chandran or Sarah Gray, DNP, NP-C at [facility]. These recommendations are developed for patients with specific medical conditions and are not meant to be  distributed or used by others that are not actively receiving care from either provider listed above at [facility]. It is not appropriate to participate in the above eating plans without proper medical supervision.   Reference: Fung, J. The obesity code. [place]: [place]; 2016.   

## 2020-11-26 NOTE — Progress Notes (Signed)
Metrics: Intervention Frequency ACO  Documented Smoking Status Yearly  Screened one or more times in 24 months  Cessation Counseling or  Active cessation medication Past 24 months  Past 24 months   Guideline developer: UpToDate (See UpToDate for funding source) Date Released: 2014       Wellness Office Visit  Subjective:  Patient ID: Linda [facility], female    DOB: [birth year]  Age: 60 y.o. MRN: [id number]  CC: This lady comes in for follow-up regarding her blood work and further recommendations. HPI  Today she came in requesting a CT scan of the abdomen and pelvis because she has noticed abdominal swelling in the lower abdomen/pelvis.  She is concerned about this. Discussed order blood work with her which showed suboptimal hormones, which is not surprising as she is in the menopause. Past Medical History:  Diagnosis Date  . Allergy    metal  . Anxiety   . Asthma    childhood, resolution as teen  . Colon polyp    adenomatous  . Depression   . GERD (gastroesophageal reflux disease)   . Headache(784.0)   . Hyperlipidemia   . Migraine    Past Surgical History:  Procedure Laterality Date  . BREAST BIOPSY  1987    [place], left  . CHOLECYSTECTOMY N/A 02/05/2018   Procedure: LAPAROSCOPIC CHOLECYSTECTOMY WITH INTRAOPERATIVE CHOLANGIOGRAM;  Surgeon: Armandina Gemma, MD;  Location: [facility];  Service: General;  Laterality: N/A;  . ESI  2010(X2),2011,2012   Dr Nelva Bush  . LAPAROSCOPY    . TONSILLECTOMY AND ADENOIDECTOMY    . UTERINE FIBROID SURGERY  2002     Family History  Problem Relation Age of Onset  . Hyperlipidemia Mother   . Hyperlipidemia Father   . Stroke Father   . Hypertension Father   . Breast cancer Maternal Grandmother   . Breast cancer Maternal Aunt   . Diabetes Neg Hx     Social History   Social History Narrative   Divorced since 2012,married for 9 years.Lives alone.From [place],in [place].High school teacher-English.   Social History   Tobacco  Use  . Smoking status: Former Research scientist (life sciences)  . Smokeless tobacco: Never Used  . Tobacco comment: smoked [place], up < 1 ppd  Substance Use Topics  . Alcohol use: No    Current Meds  Medication Sig  . albuterol (VENTOLIN HFA) 108 (90 Base) MCG/ACT inhaler Inhale 2 puffs into the lungs every 6 (six) hours as needed for wheezing or shortness of breath.  Marland Kitchen aspirin 325 MG tablet Take 325 mg by mouth every 6 (six) hours as needed (for headaches or inflammation).   . milk thistle 175 MG tablet Take 175 mg by mouth daily.  . Probiotic Product (PROBIOTIC DAILY PO) Take 1 capsule by mouth daily.       Objective:   Today's Vitals: BP 130/80 (BP Location: Left Arm, Patient Position: Sitting, Cuff Size: Normal)   Pulse 88   Temp (!) 97.3 F (36.3 C) (Temporal)   Resp 18   Ht 5\' 5"  (1.651 m)   Wt 241 lb (109.3 kg)   SpO2 93%   BMI 40.10 kg/m  Vitals with BMI 11/26/2020 10/30/2020 03/06/2020  Height 5\' 5"  5\' 4"  5\' 6"   Weight 241 lbs 241 lbs 3 oz 245 lbs 10 oz  BMI 40.1 93.81 01.75  Systolic 102 585 277  Diastolic 80 80 76  Pulse 88 77 77     Physical Exam  Remains morbidly obese.  No change  in weight.  Blood pressure has improved.     Assessment   1. Abdominal swelling   2. Morbid obesity ([place])   3. Primary ovarian failure       Tests ordered Orders Placed This Encounter  Procedures  . CT Abdomen Pelvis Wo Contrast     Plan: 1. We discussed nutrition and the concept of intermittent fasting combined with a plant-based diet, ensuring adequate hydration as well as salt intake for prolonged periods of fasting.  Again her diet sheet. 2. We discussed briefly bioidentical hormone therapy and the difference between this and synthetic hormones.  We discussed the women's health initiative study.  She will think about it. 3. I will order CT scan of the abdomen/pelvis due to her abdominal swelling. 4. Follow-up in about 2 months.  Today I spent 45 minutes with this patient discussing  nutrition and bioidentical hormones.   No orders of the defined types were placed in this encounter.   Doree Albee, MD

## 2020-12-18 ENCOUNTER — Ambulatory Visit (HOSPITAL_COMMUNITY): Payer: BC Managed Care – PPO

## 2020-12-28 ENCOUNTER — Other Ambulatory Visit: Payer: BC Managed Care – PPO

## 2021-01-14 ENCOUNTER — Ambulatory Visit (INDEPENDENT_AMBULATORY_CARE_PROVIDER_SITE_OTHER): Payer: BC Managed Care – PPO | Admitting: Internal Medicine

## 2021-01-14 ENCOUNTER — Encounter (INDEPENDENT_AMBULATORY_CARE_PROVIDER_SITE_OTHER): Payer: Self-pay

## 2021-01-18 ENCOUNTER — Other Ambulatory Visit: Payer: Self-pay

## 2021-01-18 ENCOUNTER — Ambulatory Visit
Admission: RE | Admit: 2021-01-18 | Discharge: 2021-01-18 | Disposition: A | Discharge status: Home / Self Care | Payer: BC Managed Care – PPO | Source: Ambulatory Visit | Attending: Internal Medicine | Admitting: Internal Medicine

## 2021-01-21 ENCOUNTER — Ambulatory Visit (INDEPENDENT_AMBULATORY_CARE_PROVIDER_SITE_OTHER): Payer: BC Managed Care – PPO | Admitting: Internal Medicine

## 2021-02-18 ENCOUNTER — Ambulatory Visit (INDEPENDENT_AMBULATORY_CARE_PROVIDER_SITE_OTHER): Payer: BC Managed Care – PPO | Admitting: Internal Medicine

## 2021-09-16 ENCOUNTER — Telehealth: Payer: Self-pay | Admitting: Pulmonary Disease

## 2021-09-17 NOTE — Telephone Encounter (Signed)
Lm for patient.  

## 2021-09-19 ENCOUNTER — Other Ambulatory Visit: Payer: BC Managed Care – PPO

## 2021-09-30 NOTE — Telephone Encounter (Signed)
Received CT abdomen/pelvis report from 01/18/21.  Image is available to view in epic and CT report placed in Dr. Matilde Bash box to review. ?Patient LOV was 03/06/20. ?

## 2021-10-09 NOTE — Telephone Encounter (Signed)
Noted.   ?Reviewed CT scan report and images ?Lung bases are clear. ?

## 2021-12-31 DIAGNOSIS — Z0289 Encounter for other administrative examinations: Secondary | ICD-10-CM

## 2022-01-06 ENCOUNTER — Ambulatory Visit (INDEPENDENT_AMBULATORY_CARE_PROVIDER_SITE_OTHER): Payer: BC Managed Care – PPO | Admitting: Family Medicine

## 2022-01-06 ENCOUNTER — Encounter (INDEPENDENT_AMBULATORY_CARE_PROVIDER_SITE_OTHER): Payer: Self-pay | Admitting: Family Medicine

## 2022-01-06 VITALS — BP 117/75 | HR 78 | Temp 98.0°F | Ht 65.0 in | Wt 241.0 lb

## 2022-01-06 DIAGNOSIS — R1013 Epigastric pain: Secondary | ICD-10-CM

## 2022-01-06 DIAGNOSIS — E559 Vitamin D deficiency, unspecified: Secondary | ICD-10-CM

## 2022-01-06 DIAGNOSIS — G8929 Other chronic pain: Secondary | ICD-10-CM | POA: Diagnosis not present

## 2022-01-06 DIAGNOSIS — Z1331 Encounter for screening for depression: Secondary | ICD-10-CM | POA: Diagnosis not present

## 2022-01-06 DIAGNOSIS — M546 Pain in thoracic spine: Secondary | ICD-10-CM

## 2022-01-06 DIAGNOSIS — K5909 Other constipation: Secondary | ICD-10-CM | POA: Diagnosis not present

## 2022-01-06 DIAGNOSIS — R7309 Other abnormal glucose: Secondary | ICD-10-CM

## 2022-01-06 DIAGNOSIS — R0602 Shortness of breath: Secondary | ICD-10-CM | POA: Diagnosis not present

## 2022-01-06 DIAGNOSIS — R5383 Other fatigue: Secondary | ICD-10-CM | POA: Diagnosis not present

## 2022-01-06 DIAGNOSIS — R635 Abnormal weight gain: Secondary | ICD-10-CM

## 2022-01-06 DIAGNOSIS — Z13220 Encounter for screening for lipoid disorders: Secondary | ICD-10-CM

## 2022-01-06 DIAGNOSIS — Z6841 Body Mass Index (BMI) 40.0 and over, adult: Secondary | ICD-10-CM

## 2022-01-07 LAB — HEMOGLOBIN A1C
Est. average glucose Bld gHb Est-mCnc: 111 mg/dL
Hgb A1c MFr Bld: 5.5 % (ref 4.8–5.6)

## 2022-01-07 LAB — LIPID PANEL WITH LDL/HDL RATIO
Cholesterol, Total: 238 mg/dL — ABNORMAL HIGH (ref 100–199)
HDL: 55 mg/dL (ref >39)
LDL Chol Calc (NIH): 160 mg/dL — ABNORMAL HIGH (ref 0–99)
LDL/HDL Ratio: 2.9 ratio (ref 0.0–3.2)
Triglycerides: 126 mg/dL (ref 0–149)
VLDL Cholesterol Cal: 23 mg/dL (ref 5–40)

## 2022-01-07 LAB — FOLATE: Folate: 17.4 ng/mL (ref >3.0)

## 2022-01-07 LAB — INSULIN, RANDOM: INSULIN: 14.2 u[IU]/mL (ref 2.6–24.9)

## 2022-01-07 LAB — T4, FREE: Free T4: 1.03 ng/dL (ref 0.82–1.77)

## 2022-01-07 LAB — TSH: TSH: 2.65 u[IU]/mL (ref 0.450–4.500)

## 2022-01-07 LAB — VITAMIN B12: Vitamin B-12: 495 pg/mL (ref 232–1245)

## 2022-01-07 LAB — VITAMIN D 25 HYDROXY (VIT D DEFICIENCY, FRACTURES): Vit D, 25-Hydroxy: 24.8 ng/mL — ABNORMAL LOW (ref 30.0–100.0)
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
## 2022-01-27 ENCOUNTER — Ambulatory Visit (INDEPENDENT_AMBULATORY_CARE_PROVIDER_SITE_OTHER): Payer: Self-pay | Admitting: Family Medicine

## 2022-01-28 ENCOUNTER — Ambulatory Visit (INDEPENDENT_AMBULATORY_CARE_PROVIDER_SITE_OTHER): Payer: BC Managed Care – PPO | Admitting: Family Medicine

## 2022-01-28 ENCOUNTER — Encounter (INDEPENDENT_AMBULATORY_CARE_PROVIDER_SITE_OTHER): Payer: Self-pay | Admitting: Family Medicine

## 2022-01-28 VITALS — BP 117/81 | HR 81 | Temp 98.2°F | Ht 65.0 in | Wt 244.0 lb

## 2022-01-28 DIAGNOSIS — M549 Dorsalgia, unspecified: Secondary | ICD-10-CM

## 2022-01-28 DIAGNOSIS — G8929 Other chronic pain: Secondary | ICD-10-CM

## 2022-01-28 DIAGNOSIS — E559 Vitamin D deficiency, unspecified: Secondary | ICD-10-CM | POA: Diagnosis not present

## 2022-01-28 DIAGNOSIS — Z6841 Body Mass Index (BMI) 40.0 and over, adult: Secondary | ICD-10-CM

## 2022-01-28 DIAGNOSIS — E669 Obesity, unspecified: Secondary | ICD-10-CM

## 2022-01-28 DIAGNOSIS — E7849 Other hyperlipidemia: Secondary | ICD-10-CM | POA: Diagnosis not present

## 2022-01-28 DIAGNOSIS — K259 Gastric ulcer, unspecified as acute or chronic, without hemorrhage or perforation: Secondary | ICD-10-CM

## 2022-01-28 MED ORDER — VITAMIN D (ERGOCALCIFEROL) 1.25 MG (50000 UNIT) PO CAPS: 50000.0000 [IU] | ORAL_CAPSULE | ORAL | 0 refills | Status: AC

## 2022-02-10 NOTE — Progress Notes (Unsigned)
Chief Complaint:   OBESITY Linda Mata is here to discuss her progress with her obesity treatment plan along with follow-up of her obesity related diagnoses. [name] is on the Category 1 Plan and states she is following her eating plan approximately 0% of the time. [name] states she is doing 0 minutes 0 times per week.  Today's visit was #: 2 Starting weight: 241 lbs Starting date: 01/06/2022 Today's weight: 244 lbs Today's date: 01/28/2022 Total lbs lost to date: 0 Total lbs lost since last in-office visit: 0  Interim History: [name] did not follow the eating plan.  Still skips breakfast.  Reading labels for sugar and has cut back.  Has started swimming.  Complains of fatigue and epigastric pain.  Stress at work contributes to meal skipping.  Walking less and motivation to plan meals is lacking.  Subjective:   1. Vitamin D deficiency [name]'s vitamin D level on 01/06/2022 was 24.8.  2. Other hyperlipidemia [name]'s LDL was 160 and total cholesterol 238 on 01/06/2022.  Not on treatment.  3. Chronic back pain, unspecified back location, unspecified back pain laterality [name] is able to walk and do Zumba.  She avoids NSAIDs with a history of GI ulcers.  4. Gastric ulcer, unspecified chronicity, unspecified whether gastric ulcer hemorrhage or perforation present [name]'s last EGD was 2 years ago.  She avoids NSAIDs.  Complains of epigastric pain and nausea.  She refuses medications like Ranitidine.   Assessment/Plan:   1. Vitamin D deficiency [name] will begin prescription vitamin D 50,000 units once weekly with no refills.  We will recheck labs in 3 months.  - Vitamin D, Ergocalciferol, (DRISDOL) 1.25 MG (50000 UNIT) CAPS capsule; Take 1 capsule (50,000 Units total) by mouth every 7 (seven) days.  Dispense: 5 capsule; Refill: 0  2. Other hyperlipidemia [name] will add omega-3 fish oil daily, and continue working on healthy dietary changes.  3. Chronic back pain, unspecified back location,  unspecified back pain laterality [name] will continue with weight loss and exercise as tolerated.  4. Gastric ulcer, unspecified chronicity, unspecified whether gastric ulcer hemorrhage or perforation present [name] will follow-up with GI with  upcoming visit.  5. Obesity current BMI-40.60 [name] is currently in the action stage of change. As such, her goal is to continue with weight loss efforts. She has agreed to practicing portion control and making smarter food choices, such as increasing vegetables and decreasing simple carbohydrates.   Work on protein at meals.  Increase fruit 1-2 servings per day.  Track daily steps.  Meal planning.  Exercise goals: Step goal: 7000 steps per day.  Behavioral modification strategies: increasing lean protein intake, increasing water intake, no skipping meals, emotional eating strategies, and planning for success.  [name] has agreed to follow-up with our clinic in 4 weeks. She was informed of the importance of frequent follow-up visits to maximize her success with intensive lifestyle modifications for her multiple health conditions.   Objective:   Blood pressure 117/81, pulse 81, temperature 98.2 F (36.8 C), height '5\' 5"'$  (1.651 m), weight 244 lb (110.7 kg), last menstrual period 07/07/2005, SpO2 95 %. Body mass index is 40.6 kg/m.  General: Cooperative, alert, well developed, in no acute distress. HEENT: Conjunctivae and lids unremarkable. Cardiovascular: Regular rhythm.  Lungs: Normal work of breathing. Neurologic: No focal deficits.   Lab Results  Component Value Date   CREATININE 0.74 10/16/2019   BUN 21 (H) 10/16/2019   NA 140 10/16/2019   K 4.1 10/16/2019   CL  107 10/16/2019   CO2 23 10/16/2019   Lab Results  Component Value Date   ALT 28 10/16/2019   AST 25 10/16/2019   ALKPHOS 53 10/16/2019   BILITOT 0.2 (L) 10/16/2019   Lab Results  Component Value Date   HGBA1C 5.5 01/06/2022   HGBA1C 5.5 02/08/2014   HGBA1C 5.6 01/06/2012    HGBA1C 5.5 04/08/2007   Lab Results  Component Value Date   INSULIN 14.2 01/06/2022   Lab Results  Component Value Date   TSH 2.650 01/06/2022   Lab Results  Component Value Date   CHOL 238 (H) 01/06/2022   HDL 55 01/06/2022   LDLCALC 160 (H) 01/06/2022   LDLDIRECT 139.6 04/08/2007   TRIG 126 01/06/2022   CHOLHDL 4 02/08/2014   Lab Results  Component Value Date   VD25OH 24.8 (L) 01/06/2022   Lab Results  Component Value Date   WBC 11.8 (H) 10/16/2019   HGB 13.2 10/16/2019   HCT 40.7 10/16/2019   MCV 85.5 10/16/2019   PLT 426 (H) 10/16/2019   Lab Results  Component Value Date   FERRITIN 180 10/14/2019   Attestation Statements:   Reviewed by clinician on day of visit: allergies, medications, problem list, medical history, surgical history, family history, social history, and previous encounter notes.  Wilhemena Durie, am acting as transcriptionist for Loyal Gambler, DO.  I have reviewed the above documentation for accuracy and completeness, and I agree with the above. Dell Ponto, DO

## 2022-02-12 ENCOUNTER — Encounter (INDEPENDENT_AMBULATORY_CARE_PROVIDER_SITE_OTHER): Payer: Self-pay

## 2022-02-24 ENCOUNTER — Telehealth (INDEPENDENT_AMBULATORY_CARE_PROVIDER_SITE_OTHER): Payer: Self-pay

## 2022-02-24 NOTE — Telephone Encounter (Signed)
Call back to patient, left message to return call to clinic.

## 2022-02-24 NOTE — Telephone Encounter (Signed)
Bowen, Collene Leyden, DO  Neomia Dear, CMA Please call patient back and let me know what her question is.  Dr Valetta Close        Previous Messages    ----- Message -----  From: Neomia Dear, CMA  Sent: 02/20/2022   5:11 PM EDT  To: Dell Ponto, DO  Subject: FW: medication question                         Please advise  ----- Message -----  From: Martie Round  Sent: 02/19/2022   3:09 PM EDT  To: Dell Ponto, DO; [facility]  Subject: medication question                             Hi ,     The following patient called and stated that she doesn't want to use Mychart but she had a concern about her vitamin D medication and needed some clarity . She stated that she was prescribed the wrong dosage . She asked if someone can call her back .   Thanks ,  Sara Lee

## 2022-02-24 NOTE — Telephone Encounter (Signed)
Patient called back states she figured out the answer. Patient states she will not be coming back to the program she has to much going on in her life right now.

## 2022-03-31 ENCOUNTER — Ambulatory Visit (INDEPENDENT_AMBULATORY_CARE_PROVIDER_SITE_OTHER): Payer: BC Managed Care – PPO | Admitting: Family Medicine
